# Patient Record
Sex: Male | Born: 1970
Health system: Southern US, Community
[De-identification: ages and names within clinical notes are randomized; demographics above are authoritative.]

## PROBLEM LIST (undated history)

## (undated) DIAGNOSIS — E559 Vitamin D deficiency, unspecified: Secondary | ICD-10-CM

## (undated) DIAGNOSIS — R5383 Other fatigue: Secondary | ICD-10-CM

## (undated) DIAGNOSIS — R0602 Shortness of breath: Secondary | ICD-10-CM

## (undated) DIAGNOSIS — B019 Varicella without complication: Secondary | ICD-10-CM

## (undated) DIAGNOSIS — E785 Hyperlipidemia, unspecified: Secondary | ICD-10-CM

## (undated) DIAGNOSIS — J45909 Unspecified asthma, uncomplicated: Secondary | ICD-10-CM

## (undated) DIAGNOSIS — R7303 Prediabetes: Secondary | ICD-10-CM

## (undated) DIAGNOSIS — M1990 Unspecified osteoarthritis, unspecified site: Secondary | ICD-10-CM

## (undated) HISTORY — DX: Varicella without complication: B01.9

## (undated) HISTORY — DX: Unspecified osteoarthritis, unspecified site: M19.90

## (undated) HISTORY — PX: NASAL RECONSTRUCTION: SHX2069

## (undated) HISTORY — DX: Other fatigue: R53.83

## (undated) HISTORY — DX: Shortness of breath: R06.02

## (undated) HISTORY — DX: Vitamin D deficiency, unspecified: E55.9

## (undated) HISTORY — DX: Unspecified asthma, uncomplicated: J45.909

## (undated) HISTORY — DX: Hyperlipidemia, unspecified: E78.5

## (undated) HISTORY — DX: Prediabetes: R73.03

---

## 1997-09-12 HISTORY — PX: KNEE ARTHROSCOPY: SUR90

## 2012-04-03 ENCOUNTER — Encounter: Payer: Self-pay | Admitting: Family Medicine

## 2012-04-03 ENCOUNTER — Ambulatory Visit (INDEPENDENT_AMBULATORY_CARE_PROVIDER_SITE_OTHER): Payer: BC Managed Care – PPO | Admitting: Family Medicine

## 2012-04-03 VITALS — BP 124/80 | HR 92 | Ht 74.0 in | Wt 268.0 lb

## 2012-04-03 DIAGNOSIS — Z0000 Encounter for general adult medical examination without abnormal findings: Secondary | ICD-10-CM

## 2012-04-03 DIAGNOSIS — J301 Allergic rhinitis due to pollen: Secondary | ICD-10-CM

## 2012-04-03 LAB — CBC WITH DIFFERENTIAL/PLATELET
Basophils Absolute: 0 10*3/uL (ref 0.0–0.1)
Eosinophils Relative: 2 % (ref 0–5)
Lymphocytes Relative: 50 % — ABNORMAL HIGH (ref 12–46)
MCV: 85.4 fL (ref 78.0–100.0)
Neutrophils Relative %: 39 % — ABNORMAL LOW (ref 43–77)
Platelets: 241 10*3/uL (ref 150–400)
RDW: 14.8 % (ref 11.5–15.5)
WBC: 5.3 10*3/uL (ref 4.0–10.5)

## 2012-04-03 LAB — COMPREHENSIVE METABOLIC PANEL
ALT: 46 U/L (ref 0–53)
AST: 31 U/L (ref 0–37)
Calcium: 9.6 mg/dL (ref 8.4–10.5)
Chloride: 106 mEq/L (ref 96–112)
Creat: 1.26 mg/dL (ref 0.50–1.35)
Total Bilirubin: 0.5 mg/dL (ref 0.3–1.2)

## 2012-04-03 LAB — HEMOCCULT GUIAC POC 1CARD (OFFICE)

## 2012-04-03 LAB — POCT URINALYSIS DIPSTICK
Leukocytes, UA: NEGATIVE
Nitrite, UA: NEGATIVE
Protein, UA: NEGATIVE
Urobilinogen, UA: NEGATIVE

## 2012-04-03 LAB — LIPID PANEL
Total CHOL/HDL Ratio: 5.4 Ratio
VLDL: 21 mg/dL (ref 0–40)

## 2012-04-03 NOTE — Progress Notes (Signed)
  Subjective:    Patient ID: Steven Gillespie, male    DOB: 12/20/1970, 41 y.o.   MRN: [id number]  HPI He is here for complete examination. He has no particular concerns or complaints. He does have underlying allergies and treats this with over-the-counter medications. He admits to not exercising or eating appropriately. He is married with 3 children. His work is going quite well. Family and social history was reviewed.   Review of Systems  Constitutional: Negative.   HENT: Negative.   Eyes: Negative.   Respiratory: Negative.   Gastrointestinal: Negative.   Genitourinary: Negative.   Musculoskeletal: Negative.   Neurological: Negative.   Hematological: Negative.   Psychiatric/Behavioral: Negative.        Objective:   Physical Exam BP 124/80  Pulse 92  Ht 6\' 2"  (1.88 m)  Wt 268 lb (121.564 kg)  BMI 34.41 kg/m2  SpO2 98%  General Appearance:    Alert, cooperative, no distress, appears stated age  Head:    Normocephalic, without obvious abnormality, atraumatic  Eyes:    PERRL, conjunctiva/corneas clear, EOM's intact, fundi    benign  Ears:    Normal TM's and external ear canals  Nose:   Nares normal, mucosa normal, no drainage or sinus   tenderness  Throat:   Lips, mucosa, and tongue normal; teeth and gums normal  Neck:   Supple, no lymphadenopathy;  thyroid:  no   enlargement/tenderness/nodules; no carotid   bruit or JVD  Back:    Spine nontender, no curvature, ROM normal, no CVA     tenderness  Lungs:     Clear to auscultation bilaterally without wheezes, rales or     ronchi; respirations unlabored  Chest Wall:    No tenderness or deformity   Heart:    Regular rate and rhythm, S1 and S2 normal, no murmur, rub   or gallop  Breast Exam:    No chest wall tenderness, masses or gynecomastia  Abdomen:     Soft, non-tender, nondistended, normoactive bowel sounds,    no masses, no hepatosplenomegaly  Genitalia:    Normal male external genitalia without lesions.  Testicles without  masses.  No inguinal hernias.  Rectal:    Normal sphincter tone, no masses or tenderness; guaiac negative stool.  Prostate smooth, no nodules, not enlarged.  Extremities:   No clubbing, cyanosis or edema  Pulses:   2+ and symmetric all extremities  Skin:   Skin color, texture, turgor normal, no rashes or lesions  Lymph nodes:   Cervical, supraclavicular, and axillary nodes normal  Neurologic:   CNII-XII intact, normal strength, sensation and gait; reflexes 2+ and symmetric throughout          Psych:   Normal mood, affect, hygiene and grooming.           Assessment & Plan:   1. Routine general medical examination at a health care facility  Hemoccult - 1 Card (office), CBC with Differential, Comprehensive metabolic panel, Lipid panel, POCT Urinalysis Dipstick  2. Allergic rhinitis due to pollen     discussed for him the need to make dietary changes especially in regard to cutting back on carbohydrates as well as increasing his physical activities.

## 2012-04-04 NOTE — Progress Notes (Signed)
Mailed copy of labs and diet info 

## 2014-05-20 ENCOUNTER — Ambulatory Visit (INDEPENDENT_AMBULATORY_CARE_PROVIDER_SITE_OTHER): Payer: 59 | Admitting: Family Medicine

## 2014-05-20 VITALS — BP 120/80 | HR 81 | Temp 99.1°F | Resp 24 | Ht 73.0 in | Wt 267.5 lb

## 2014-05-20 DIAGNOSIS — H65199 Other acute nonsuppurative otitis media, unspecified ear: Secondary | ICD-10-CM

## 2014-05-20 DIAGNOSIS — H6690 Otitis media, unspecified, unspecified ear: Secondary | ICD-10-CM | POA: Insufficient documentation

## 2014-05-20 DIAGNOSIS — H65113 Acute and subacute allergic otitis media (mucoid) (sanguinous) (serous), bilateral: Secondary | ICD-10-CM

## 2014-05-20 DIAGNOSIS — H65119 Acute and subacute allergic otitis media (mucoid) (sanguinous) (serous), unspecified ear: Secondary | ICD-10-CM

## 2014-05-20 MED ORDER — FLUTICASONE PROPIONATE 50 MCG/ACT NA SUSP: 2.0000 | Freq: Every day | NASAL | Status: DC

## 2014-05-20 MED ORDER — AMOXICILLIN 875 MG PO TABS: 875.0000 mg | ORAL_TABLET | Freq: Two times a day (BID) | ORAL | Status: DC

## 2014-05-20 NOTE — Progress Notes (Signed)
  Steven Gillespie - 43 y.o. male MRN [id number]  Date of birth: 1971/03/25  SUBJECTIVE:  Including CC & ROS.  The patient presents for evaluation of: Chief Complaint  Patient presents with  . Cough    Coughing up green phelgm  . Sore Throat    Exposure to Strep  . Sinusitis    Sore Throat; Head Pressure: Pt reports 4 days of the above sx. Child recently diagnosed with STREP ~2 weeks ago.  Was feeling well until 4-5 days ago and has had worsening sx since that time. Taking OTC NSAIDs without significant relief.  Throat pain is moderate to severe.  No difficulty swallowing/breathing.  + Headache; some facial pressure/fullness consistent with sinus infections he typically gets this time of the year.  On Fluticasone for allergic rhinitis but ran out ~10 days ago.  HISTORY: Past Medical, Surgical, Social, and Family History Reviewed & Updated per EMR. Pertinent Historical Findings include: Seasonal allergies   OBJECTIVE FINDINGS:  VS:  HT:6\' 1"  (185.4 cm)   WT:267 lb 8 oz (121.337 kg)  BMI:35.4          BP:120/80 mmHg  HR:81bpm  TEMP:99.1 F (37.3 C)(Oral)  RESP:95 %  PHYSICAL EXAM:            GENERAL:  Adult well built [place] American  male. In no discomfort; no respiratory distress                PSYCH:  alert and appropriate, good insight      HNEENT:  mmm, no JVD, + anterior lymphadenopathy. Tonsillary hypertrophy with minimal exudate; posterior-oropharyngeal erythema.  L>R TM with poor light reflection; cloudy effusion with surrounding erythema of TM    CARDIAC:  RRR, S1/S2 heard, no murmur       LUNGS:  CTA B, no wheezes, no crackles  ABDOMEN:       EXTREM:  Warm, well perfused.  Moves all 4 extremities spontaneously; no lateralization.  Pedal pulses 2/4.  not pretibial edema.   ASSESSMENT: 1. Acute mucoid otitis media of both ears    Given overall symptoms and findings on otoscopic exam likely bacterial etiology  PLAN: See problem based charting & AVS for additional  documentation. Rx for Amoxicillin X 14 days for otitis media. OTC NSAID for discomfort Restart Allergy treatment given likely contributing to symptomatology Return if symptoms worsen or fail to improve.     I agree with A/P above . Dr Cardell Peach 05/21/14

## 2014-05-20 NOTE — Patient Instructions (Signed)

## 2018-08-28 ENCOUNTER — Encounter: Payer: Self-pay | Admitting: Family Medicine

## 2018-08-28 ENCOUNTER — Ambulatory Visit (INDEPENDENT_AMBULATORY_CARE_PROVIDER_SITE_OTHER): Payer: 59 | Admitting: Family Medicine

## 2018-08-28 VITALS — BP 120/70 | HR 85 | Ht 73.0 in | Wt 281.5 lb

## 2018-08-28 DIAGNOSIS — Z6837 Body mass index (BMI) 37.0-37.9, adult: Secondary | ICD-10-CM | POA: Diagnosis not present

## 2018-08-28 DIAGNOSIS — E7800 Pure hypercholesterolemia, unspecified: Secondary | ICD-10-CM | POA: Diagnosis not present

## 2018-08-28 DIAGNOSIS — D229 Melanocytic nevi, unspecified: Secondary | ICD-10-CM | POA: Insufficient documentation

## 2018-08-28 DIAGNOSIS — Z0000 Encounter for general adult medical examination without abnormal findings: Secondary | ICD-10-CM

## 2018-08-28 DIAGNOSIS — E6609 Other obesity due to excess calories: Secondary | ICD-10-CM | POA: Diagnosis not present

## 2018-08-28 LAB — CBC
HCT: 44.5 % (ref 39.0–52.0)
Hemoglobin: 15.2 g/dL (ref 13.0–17.0)
MCHC: 34.1 g/dL (ref 30.0–36.0)
MCV: 88.2 fl (ref 78.0–100.0)
Platelets: 249 10*3/uL (ref 150.0–400.0)
RBC: 5.04 Mil/uL (ref 4.22–5.81)
RDW: 14.7 % (ref 11.5–15.5)
WBC: 5.9 10*3/uL (ref 4.0–10.5)

## 2018-08-28 LAB — LIPID PANEL
CHOLESTEROL: 275 mg/dL — AB (ref 0–200)
HDL: 53 mg/dL (ref >39.00)
LDL Cholesterol: 199 mg/dL — ABNORMAL HIGH (ref 0–99)
NonHDL: 222.19
TRIGLYCERIDES: 114 mg/dL (ref 0.0–149.0)
Total CHOL/HDL Ratio: 5
VLDL: 22.8 mg/dL (ref 0.0–40.0)

## 2018-08-28 LAB — URINALYSIS, ROUTINE W REFLEX MICROSCOPIC
Bilirubin Urine: NEGATIVE
Hgb urine dipstick: NEGATIVE
Ketones, ur: NEGATIVE
Leukocytes, UA: NEGATIVE
Nitrite: NEGATIVE
RBC / HPF: NONE SEEN (ref 0–?)
Specific Gravity, Urine: <=1.005 — AB (ref 1.000–1.030)
Total Protein, Urine: NEGATIVE
Urine Glucose: NEGATIVE
Urobilinogen, UA: 0.2 (ref 0.0–1.0)
pH: 6 (ref 5.0–8.0)

## 2018-08-28 LAB — COMPREHENSIVE METABOLIC PANEL
ALT: 39 U/L (ref 0–53)
AST: 26 U/L (ref 0–37)
Albumin: 4.6 g/dL (ref 3.5–5.2)
Alkaline Phosphatase: 53 U/L (ref 39–117)
BUN: 14 mg/dL (ref 6–23)
CO2: 28 mEq/L (ref 19–32)
Calcium: 9.8 mg/dL (ref 8.4–10.5)
Chloride: 103 mEq/L (ref 96–112)
Creatinine, Ser: 1.18 mg/dL (ref 0.40–1.50)
GFR: 84.54 mL/min (ref >60.00)
Glucose, Bld: 93 mg/dL (ref 70–99)
Potassium: 3.9 mEq/L (ref 3.5–5.1)
Sodium: 137 mEq/L (ref 135–145)
Total Bilirubin: 0.5 mg/dL (ref 0.2–1.2)
Total Protein: 7.5 g/dL (ref 6.0–8.3)

## 2018-08-28 LAB — LDL CHOLESTEROL, DIRECT: Direct LDL: 191 mg/dL

## 2018-08-28 NOTE — Progress Notes (Addendum)
Established Patient Office Visit  Subjective:  Patient ID: Steven Gillespie, male    DOB: 09-Sep-1971  Age: 47 y.o. MRN: [id number]  CC:  Chief Complaint  Patient presents with  . Establish Care    HPI [name] presents for a physical exam and would like for me to take a look at a mole on his right lower leg.  Mole is been changing over the last year or so it is increasing in size by circumference in depth.  It has not cracked or bled.  Patient works as a Landscape architect.  He is married and has 12 and 14-year-old sons.  He does not smoke or use illicit drugs.  He rarely drinks alcohol.  His father is 1 years old and has a past medical history of hypertension, prostate cancer, COPD, elevated cholesterol, peripheral vascular disease.  He developed prostate cancer in the last 5 years and has a history of substance abuse in his distant past.  Patient's mom is 29 and has a history of elevated cholesterol.  Chart review shows that patient had an LDL of 201 back in 2013.  He is not exercising regularly.  Past Medical History:  Diagnosis Date  . Asthma   . Chicken pox     Past Surgical History:  Procedure Laterality Date  . KNEE ARTHROSCOPY  1999    Family History  Problem Relation Age of Onset  . Hyperlipidemia Mother   . Asthma Father   . Cancer Father   . Drug abuse Father   . Hearing loss Father   . Heart disease Father   . Heart attack Father   . Hypertension Father     Social History   Socioeconomic History  . Marital status: Married    Spouse name: Not on file  . Number of children: Not on file  . Years of education: Not on file  . Highest education level: Not on file  Occupational History  . Not on file  Social Needs  . Financial resource strain: Not on file  . Food insecurity:    Worry: Not on file    Inability: Not on file  . Transportation needs:    Medical: Not on file    Non-medical: Not on file  Tobacco Use  . Smoking status: Never  Smoker  . Smokeless tobacco: Never Used  Substance and Sexual Activity  . Alcohol use: Yes    Alcohol/week: 4.0 standard drinks    Types: 4 Cans of beer per week  . Drug use: No  . Sexual activity: Yes    Partners: Female  Lifestyle  . Physical activity:    Days per week: Not on file    Minutes per session: Not on file  . Stress: Not on file  Relationships  . Social connections:    Talks on phone: Not on file    Gets together: Not on file    Attends religious service: Not on file    Active member of club or organization: Not on file    Attends meetings of clubs or organizations: Not on file    Relationship status: Not on file  . Intimate partner violence:    Fear of current or ex partner: Not on file    Emotionally abused: Not on file    Physically abused: Not on file    Forced sexual activity: Not on file  Other Topics Concern  . Not on file  Social History Narrative  . Not  on file    Outpatient Medications Prior to Visit  Medication Sig Dispense Refill  . amoxicillin (AMOXIL) 875 MG tablet Take 1 tablet (875 mg total) by mouth 2 (two) times daily. 28 tablet 0  . cetirizine (ZYRTEC) 10 MG tablet Take 10 mg by mouth daily.    . fluticasone (FLONASE) 50 MCG/ACT nasal spray Place 2 sprays into both nostrils daily. 16 g 1  . Multiple Vitamins-Minerals (MULTIVITAMIN WITH MINERALS) tablet Take 1 tablet by mouth daily.     No facility-administered medications prior to visit.     No Known Allergies  ROS Review of Systems  Constitutional: Negative.   HENT: Negative.   Eyes: Negative for photophobia and visual disturbance.  Respiratory: Negative.   Cardiovascular: Negative.   Gastrointestinal: Negative.   Endocrine: Negative for polyphagia and polyuria.  Genitourinary: Negative.   Musculoskeletal: Negative for gait problem and joint swelling.  Skin: Positive for color change. Negative for pallor, rash and wound.  Allergic/Immunologic: Negative for immunocompromised  state.  Neurological: Negative for seizures, light-headedness and numbness.  Hematological: Does not bruise/bleed easily.  Psychiatric/Behavioral: Negative.       Objective:    Physical Exam  Constitutional: He is oriented to person, place, and time. He appears well-developed and well-nourished. No distress.  HENT:  Head: Normocephalic and atraumatic.  Right Ear: External ear normal.  Left Ear: External ear normal.  Mouth/Throat: Oropharynx is clear and moist. No oropharyngeal exudate.  Eyes: Pupils are equal, round, and reactive to light. Conjunctivae are normal. Right eye exhibits no discharge. Left eye exhibits no discharge. No scleral icterus.  Neck: Neck supple. No JVD present. No tracheal deviation present. No thyromegaly present.  Cardiovascular: Normal rate, regular rhythm and normal heart sounds.  Pulmonary/Chest: Effort normal and breath sounds normal. No stridor.  Abdominal: Soft. Bowel sounds are normal. He exhibits no distension. There is no abdominal tenderness. There is no rebound and no guarding. Hernia confirmed negative in the right inguinal area and confirmed negative in the left inguinal area.  Genitourinary: Right testis shows no mass, no swelling and no tenderness. Right testis is descended. Left testis shows no mass, no swelling and no tenderness. Left testis is descended. Circumcised. No hypospadias, penile erythema or penile tenderness. No discharge found.  Musculoskeletal:        General: No edema.  Lymphadenopathy:    He has no cervical adenopathy.       Right: No inguinal adenopathy present.       Left: No inguinal adenopathy present.  Neurological: He is alert and oriented to person, place, and time.  Skin: Skin is warm. He is not diaphoretic.     Psychiatric: He has a normal mood and affect. His behavior is normal.    BP 120/70   Pulse 85   Ht 6\' 1"  (1.854 m)   Wt 281 lb 8 oz (127.7 kg)   SpO2 97%   BMI 37.14 kg/m  Wt Readings from Last 3  Encounters:  08/28/18 281 lb 8 oz (127.7 kg)  05/20/14 267 lb 8 oz (121.3 kg)  04/03/12 268 lb (121.6 kg)   BP Readings from Last 3 Encounters:  08/28/18 120/70  05/20/14 120/80  04/03/12 124/80   Guideline developer:  UpToDate (see UpToDate for funding source) Date Released: June 2014  Health Maintenance Due  Topic Date Due  . HIV Screening  02/09/1985  . TETANUS/TDAP  02/09/1989    There are no preventive care reminders to display for this patient.  No  results found for: TSH Lab Results  Component Value Date   WBC 5.9 08/28/2018   HGB 15.2 08/28/2018   HCT 44.5 08/28/2018   MCV 88.2 08/28/2018   PLT 249.0 08/28/2018   Lab Results  Component Value Date   NA 137 08/28/2018   K 3.9 08/28/2018   CO2 28 08/28/2018   GLUCOSE 93 08/28/2018   BUN 14 08/28/2018   CREATININE 1.18 08/28/2018   BILITOT 0.5 08/28/2018   ALKPHOS 53 08/28/2018   AST 26 08/28/2018   ALT 39 08/28/2018   PROT 7.5 08/28/2018   ALBUMIN 4.6 08/28/2018   CALCIUM 9.8 08/28/2018   GFR 84.54 08/28/2018   Lab Results  Component Value Date   CHOL 275 (H) 08/28/2018   Lab Results  Component Value Date   HDL 53.00 08/28/2018   Lab Results  Component Value Date   LDLCALC 199 (H) 08/28/2018   Lab Results  Component Value Date   TRIG 114.0 08/28/2018   Lab Results  Component Value Date   CHOLHDL 5 08/28/2018   No results found for: HGBA1C    Assessment & Plan:   Problem List Items Addressed This Visit      Musculoskeletal and Integument   Atypical nevi   Relevant Orders   Ambulatory referral to Dermatology     Other   Health care maintenance - Primary   Relevant Orders   Comprehensive metabolic panel (Completed)   CBC (Completed)   Lipid panel (Completed)   Urinalysis, Routine w reflex microscopic (Completed)   LDL cholesterol, direct (Completed)   Class 2 obesity due to excess calories with body mass index (BMI) of 37.0 to 37.9 in adult   Elevated LDL cholesterol level    Relevant Medications   rosuvastatin (CRESTOR) 20 MG tablet      Meds ordered this encounter  Medications  . rosuvastatin (CRESTOR) 20 MG tablet    Sig: Take 1 tablet (20 mg total) by mouth daily.    Dispense:  90 tablet    Refill:  1    Follow-up: Return in about 6 months (around 02/27/2019), or if symptoms worsen or fail to improve.    Patient was given information on health promotion and disease prevention.  He was given information on exercising to lose weight.  Labs were drawn today.  Am concerned about the lesion on his right lower leg.  While this could represent a wart could also represent a melanoma in an invasive stage.  Urgent dermatology referral for evaluation.  Hopefully they will consider it to be a benign lesion.

## 2018-08-28 NOTE — Patient Instructions (Signed)
Exercising to Lose Weight Exercising can help you to lose weight. In order to lose weight through exercise, you need to do vigorous-intensity exercise. You can tell that you are exercising with vigorous intensity if you are breathing very hard and fast and cannot hold a conversation while exercising. Moderate-intensity exercise helps to maintain your current weight. You can tell that you are exercising at a moderate level if you have a higher heart rate and faster breathing, but you are still able to hold a conversation. How often should I exercise? Choose an activity that you enjoy and set realistic goals. Your health care provider can help you to make an activity plan that works for you. Exercise regularly as directed by your health care provider. This may include:  Doing resistance training twice each week, such as: ? Push-ups. ? Sit-ups. ? Lifting weights. ? Using resistance bands.  Doing a given intensity of exercise for a given amount of time. Choose from these options: ? 150 minutes of moderate-intensity exercise every week. ? 75 minutes of vigorous-intensity exercise every week. ? A mix of moderate-intensity and vigorous-intensity exercise every week.  Children, pregnant women, people who are out of shape, people who are overweight, and older adults may need to consult a health care provider for individual recommendations. If you have any sort of medical condition, be sure to consult your health care provider before starting a new exercise program. What are some activities that can help me to lose weight?  Walking at a rate of at least 4.5 miles an hour.  Jogging or running at a rate of 5 miles per hour.  Biking at a rate of at least 10 miles per hour.  Lap swimming.  Roller-skating or in-line skating.  Cross-country skiing.  Vigorous competitive sports, such as football, basketball, and soccer.  Jumping rope.  Aerobic dancing. How can I be more active in my day-to-day  activities?  Use the stairs instead of the elevator.  Take a walk during your lunch break.  If you drive, park your car farther away from work or school.  If you take public transportation, get off one stop early and walk the rest of the way.  Make all of your phone calls while standing up and walking around.  Get up, stretch, and walk around every 30 minutes throughout the day. What guidelines should I follow while exercising?  Do not exercise so much that you hurt yourself, feel dizzy, or get very short of breath.  Consult your health care provider prior to starting a new exercise program.  Wear comfortable clothes and shoes with good support.  Drink plenty of water while you exercise to prevent dehydration or heat stroke. Body water is lost during exercise and must be replaced.  Work out until you breathe faster and your heart beats faster. This information is not intended to replace advice given to you by your health care provider. Make sure you discuss any questions you have with your health care provider. Document Released: 10/01/2010 Document Revised: 02/04/2016 Document Reviewed: 01/30/2014 Elsevier Interactive Patient Education  2018 [place] Maintenance, Male A healthy lifestyle and preventive care is important for your health and wellness. Ask your health care provider about what schedule of regular examinations is right for you. What should I know about weight and diet? Eat a Healthy Diet  Eat plenty of vegetables, fruits, whole grains, low-fat dairy products, and lean protein.  Do not eat a lot of foods high in solid fats,  added sugars, or salt.  Maintain a Healthy Weight Regular exercise can help you achieve or maintain a healthy weight. You should:  Do at least 150 minutes of exercise each week. The exercise should increase your heart rate and make you sweat (moderate-intensity exercise).  Do strength-training exercises at least twice a  week.  Watch Your Levels of Cholesterol and Blood Lipids  Have your blood tested for lipids and cholesterol every 5 years starting at 48 years of age. If you are at high risk for heart disease, you should start having your blood tested when you are 48 years old. You may need to have your cholesterol levels checked more often if: ? Your lipid or cholesterol levels are high. ? You are older than 48 years of age. ? You are at high risk for heart disease.  What should I know about cancer screening? Many types of cancers can be detected early and may often be prevented. Lung Cancer  You should be screened every year for lung cancer if: ? You are a current smoker who has smoked for at least 30 years. ? You are a former smoker who has quit within the past 15 years.  Talk to your health care provider about your screening options, when you should start screening, and how often you should be screened.  Colorectal Cancer  Routine colorectal cancer screening usually begins at 48 years of age and should be repeated every 5-10 years until you are 48 years old. You may need to be screened more often if early forms of precancerous polyps or small growths are found. Your health care provider may recommend screening at an earlier age if you have risk factors for colon cancer.  Your health care provider may recommend using home test kits to check for hidden blood in the stool.  A small camera at the end of a tube can be used to examine your colon (sigmoidoscopy or colonoscopy). This checks for the earliest forms of colorectal cancer.  Prostate and Testicular Cancer  Depending on your age and overall health, your health care provider may do certain tests to screen for prostate and testicular cancer.  Talk to your health care provider about any symptoms or concerns you have about testicular or prostate cancer.  Skin Cancer  Check your skin from head to toe regularly.  Tell your health care provider  about any new moles or changes in moles, especially if: ? There is a change in a mole's size, shape, or color. ? You have a mole that is larger than a pencil eraser.  Always use sunscreen. Apply sunscreen liberally and repeat throughout the day.  Protect yourself by wearing long sleeves, pants, a wide-brimmed hat, and sunglasses when outside.  What should I know about heart disease, diabetes, and high blood pressure?  If you are 19-65 years of age, have your blood pressure checked every 3-5 years. If you are 36 years of age or older, have your blood pressure checked every year. You should have your blood pressure measured twice-once when you are at a hospital or clinic, and once when you are not at a hospital or clinic. Record the average of the two measurements. To check your blood pressure when you are not at a hospital or clinic, you can use: ? An automated blood pressure machine at a pharmacy. ? A home blood pressure monitor.  Talk to your health care provider about your target blood pressure.  If you are between 30-34 years old, ask  your health care provider if you should take aspirin to prevent heart disease.  Have regular diabetes screenings by checking your fasting blood sugar level. ? If you are at a normal weight and have a low risk for diabetes, have this test once every three years after the age of 75. ? If you are overweight and have a high risk for diabetes, consider being tested at a younger age or more often.  A one-time screening for abdominal aortic aneurysm (AAA) by ultrasound is recommended for men aged 68-75 years who are current or former smokers. What should I know about preventing infection? Hepatitis B If you have a higher risk for hepatitis B, you should be screened for this virus. Talk with your health care provider to find out if you are at risk for hepatitis B infection. Hepatitis C Blood testing is recommended for:  Everyone born from [birth year] through  1965.  Anyone with known risk factors for hepatitis C.  Sexually Transmitted Diseases (STDs)  You should be screened each year for STDs including gonorrhea and chlamydia if: ? You are sexually active and are younger than 48 years of age. ? You are older than 48 years of age and your health care provider tells you that you are at risk for this type of infection. ? Your sexual activity has changed since you were last screened and you are at an increased risk for chlamydia or gonorrhea. Ask your health care provider if you are at risk.  Talk with your health care provider about whether you are at high risk of being infected with HIV. Your health care provider may recommend a prescription medicine to help prevent HIV infection.  What else can I do?  Schedule regular health, dental, and eye exams.  Stay current with your vaccines (immunizations).  Do not use any tobacco products, such as cigarettes, chewing tobacco, and e-cigarettes. If you need help quitting, ask your health care provider.  Limit alcohol intake to no more than 2 drinks per day. One drink equals 12 ounces of beer, 5 ounces of wine, or 1 ounces of hard liquor.  Do not use street drugs.  Do not share needles.  Ask your health care provider for help if you need support or information about quitting drugs.  Tell your health care provider if you often feel depressed.  Tell your health care provider if you have ever been abused or do not feel safe at home. This information is not intended to replace advice given to you by your health care provider. Make sure you discuss any questions you have with your health care provider. Document Released: 02/25/2008 Document Revised: 04/27/2016 Document Reviewed: 06/02/2015 Elsevier Interactive Patient Education  [employer].

## 2018-08-29 ENCOUNTER — Encounter: Payer: Self-pay | Admitting: Family Medicine

## 2018-08-31 DIAGNOSIS — E7800 Pure hypercholesterolemia, unspecified: Secondary | ICD-10-CM | POA: Insufficient documentation

## 2018-08-31 MED ORDER — ROSUVASTATIN CALCIUM 20 MG PO TABS: 20.0000 mg | ORAL_TABLET | Freq: Every day | ORAL | 1 refills | Status: DC

## 2018-08-31 NOTE — Addendum Note (Signed)
Addended by: Abelino Derrick A on: 08/31/2018 01:06 PM   Modules accepted: Orders

## 2018-09-24 DIAGNOSIS — L918 Other hypertrophic disorders of the skin: Secondary | ICD-10-CM | POA: Diagnosis not present

## 2018-09-24 DIAGNOSIS — D485 Neoplasm of uncertain behavior of skin: Secondary | ICD-10-CM | POA: Diagnosis not present

## 2018-09-24 DIAGNOSIS — L821 Other seborrheic keratosis: Secondary | ICD-10-CM | POA: Diagnosis not present

## 2018-09-26 DIAGNOSIS — B078 Other viral warts: Secondary | ICD-10-CM | POA: Diagnosis not present

## 2018-10-01 DIAGNOSIS — B078 Other viral warts: Secondary | ICD-10-CM | POA: Diagnosis not present

## 2018-10-19 DIAGNOSIS — L821 Other seborrheic keratosis: Secondary | ICD-10-CM | POA: Diagnosis not present

## 2018-10-19 DIAGNOSIS — L918 Other hypertrophic disorders of the skin: Secondary | ICD-10-CM | POA: Diagnosis not present

## 2018-11-01 DIAGNOSIS — B078 Other viral warts: Secondary | ICD-10-CM | POA: Diagnosis not present

## 2019-02-27 ENCOUNTER — Encounter: Payer: Self-pay | Admitting: Family Medicine

## 2019-02-27 ENCOUNTER — Telehealth (INDEPENDENT_AMBULATORY_CARE_PROVIDER_SITE_OTHER): Payer: 59 | Admitting: Family Medicine

## 2019-02-27 VITALS — BP 120/78 | Wt 270.0 lb

## 2019-02-27 DIAGNOSIS — E7800 Pure hypercholesterolemia, unspecified: Secondary | ICD-10-CM

## 2019-02-27 DIAGNOSIS — Z6837 Body mass index (BMI) 37.0-37.9, adult: Secondary | ICD-10-CM | POA: Diagnosis not present

## 2019-02-27 DIAGNOSIS — E6609 Other obesity due to excess calories: Secondary | ICD-10-CM

## 2019-02-27 DIAGNOSIS — D229 Melanocytic nevi, unspecified: Secondary | ICD-10-CM

## 2019-02-27 MED ORDER — ROSUVASTATIN CALCIUM 20 MG PO TABS: 20.0000 mg | ORAL_TABLET | Freq: Every day | ORAL | 1 refills | Status: DC

## 2019-02-27 NOTE — Progress Notes (Addendum)
Established Patient Office Visit  Subjective:  Patient ID: Steven Gillespie, male    DOB: 02/11/1971  Age: 48 y.o. MRN: [id number]  CC:  Chief Complaint  Patient presents with  . Follow-up    HPI Steven Gillespie presents for follow-up of his LDL cholesterol.  Patient never started the Crestor because he said it was never sent to the pharmacy.  Both parents have elevated cholesterol and his dad suffered an MI at age 40.  Patient continues not to smoke or use illicit drugs.  Occasional beers.  He and his wife had started an exercise program by walking and lifting some weights.  He is actually been able to lose some weight by his home scales.  Past Medical History:  Diagnosis Date  . Asthma   . Chicken pox     Past Surgical History:  Procedure Laterality Date  . KNEE ARTHROSCOPY  1999    Family History  Problem Relation Age of Onset  . Hyperlipidemia Mother   . Asthma Father   . Cancer Father   . Drug abuse Father   . Hearing loss Father   . Heart disease Father   . Heart attack Father   . Hypertension Father     Social History   Socioeconomic History  . Marital status: Married    Spouse name: Not on file  . Number of children: Not on file  . Years of education: Not on file  . Highest education level: Not on file  Occupational History  . Not on file  Social Needs  . Financial resource strain: Not on file  . Food insecurity    Worry: Not on file    Inability: Not on file  . Transportation needs    Medical: Not on file    Non-medical: Not on file  Tobacco Use  . Smoking status: Never Smoker  . Smokeless tobacco: Never Used  Substance and Sexual Activity  . Alcohol use: Yes    Alcohol/week: 4.0 standard drinks    Types: 4 Cans of beer per week  . Drug use: No  . Sexual activity: Yes    Partners: Female  Lifestyle  . Physical activity    Days per week: Not on file    Minutes per session: Not on file  . Stress: Not on file  Relationships  . Social  Herbalist on phone: Not on file    Gets together: Not on file    Attends religious service: Not on file    Active member of club or organization: Not on file    Attends meetings of clubs or organizations: Not on file    Relationship status: Not on file  . Intimate partner violence    Fear of current or ex partner: Not on file    Emotionally abused: Not on file    Physically abused: Not on file    Forced sexual activity: Not on file  Other Topics Concern  . Not on file  Social History Narrative  . Not on file    Outpatient Medications Prior to Visit  Medication Sig Dispense Refill  . rosuvastatin (CRESTOR) 20 MG tablet Take 1 tablet (20 mg total) by mouth daily. 90 tablet 1   No facility-administered medications prior to visit.     No Known Allergies  ROS Review of Systems  Constitutional: Negative.   Respiratory: Negative.   Cardiovascular: Negative.   Psychiatric/Behavioral: Negative.       Objective:  Physical Exam  Constitutional: He is oriented to person, place, and time. He appears well-developed and well-nourished. No distress.  HENT:  Head: Normocephalic and atraumatic.  Right Ear: External ear normal.  Left Ear: External ear normal.  Eyes: Conjunctivae are normal. Right eye exhibits no discharge. Left eye exhibits no discharge. No scleral icterus.  Neck: No tracheal deviation present.  Pulmonary/Chest: Effort normal. No stridor.  Neurological: He is alert and oriented to person, place, and time.  Skin: He is not diaphoretic.  Psychiatric: He has a normal mood and affect. His behavior is normal.    BP 120/78   Wt 270 lb (122.5 kg)   BMI 35.62 kg/m  Wt Readings from Last 3 Encounters:  02/27/19 270 lb (122.5 kg)  08/28/18 281 lb 8 oz (127.7 kg)  05/20/14 267 lb 8 oz (121.3 kg)   BP Readings from Last 3 Encounters:  02/27/19 120/78  08/28/18 120/70  05/20/14 120/80   Guideline developer:  UpToDate (see UpToDate for funding source)  Date Released: June 2014  Health Maintenance Due  Topic Date Due  . HIV Screening  02/09/1985  . TETANUS/TDAP  02/09/1989    There are no preventive care reminders to display for this patient.  No results found for: TSH Lab Results  Component Value Date   WBC 5.9 08/28/2018   HGB 15.2 08/28/2018   HCT 44.5 08/28/2018   MCV 88.2 08/28/2018   PLT 249.0 08/28/2018   Lab Results  Component Value Date   NA 137 08/28/2018   K 3.9 08/28/2018   CO2 28 08/28/2018   GLUCOSE 93 08/28/2018   BUN 14 08/28/2018   CREATININE 1.18 08/28/2018   BILITOT 0.5 08/28/2018   ALKPHOS 53 08/28/2018   AST 26 08/28/2018   ALT 39 08/28/2018   PROT 7.5 08/28/2018   ALBUMIN 4.6 08/28/2018   CALCIUM 9.8 08/28/2018   GFR 84.54 08/28/2018   Lab Results  Component Value Date   CHOL 275 (H) 08/28/2018   Lab Results  Component Value Date   HDL 53.00 08/28/2018   Lab Results  Component Value Date   LDLCALC 199 (H) 08/28/2018   Lab Results  Component Value Date   TRIG 114.0 08/28/2018   Lab Results  Component Value Date   CHOLHDL 5 08/28/2018   No results found for: HGBA1C    Assessment & Plan:   Problem List Items Addressed This Visit      Musculoskeletal and Integument   Atypical nevi   Relevant Orders   Ambulatory referral to Dermatology     Other   Class 2 obesity due to excess calories with body mass index (BMI) of 37.0 to 37.9 in adult - Primary   Relevant Orders   CBC   Urinalysis, Routine w reflex microscopic   TSH   Elevated LDL cholesterol level   Relevant Medications   rosuvastatin (CRESTOR) 20 MG tablet   Other Relevant Orders   CBC   Comprehensive metabolic panel   LDL cholesterol, direct   Lipid panel   Urinalysis, Routine w reflex microscopic   TSH      Meds ordered this encounter  Medications  . rosuvastatin (CRESTOR) 20 MG tablet    Sig: Take 1 tablet (20 mg total) by mouth daily.    Dispense:  90 tablet    Refill:  1    Follow-up: Return in  about 3 months (around 05/30/2019).   Virtual Visit via Video Note  I connected with Docia Chuck on  02/27/19 at  8:00 AM EDT by a video enabled telemedicine application and verified that I am speaking with the correct person using two identifiers.  Location: Patient: home Provider:    I discussed the limitations of evaluation and management by telemedicine and the availability of in person appointments. The patient expressed understanding and agreed to proceed.  History of Present Illness:    Observations/Objective:   Assessment and Plan:   Follow Up Instructions:    I discussed the assessment and treatment plan with the patient. The patient was provided an opportunity to ask questions and all were answered. The patient agreed with the plan and demonstrated an understanding of the instructions.   The patient was advised to call back or seek an in-person evaluation if the symptoms worsen or if the condition fails to improve as anticipated.  I provided 20 minutes of non-face-to-face time during this encounter.   Libby Maw, MD  Asked patient to please let us know if his prescription does not go through the pharmacy or if we make an appointment for him and he does not hear about it.  We discussed Crestor and its side effects.  He is to follow-up in 3 months or sooner if needed.  Encouraged the patient to exercise, lower fat and cholesterol in his diet and continue his weight loss efforts.  6/24 addendum: Patient sent message to Crestor given him a headache and would like something else.  Just sent in a prescription for atorvastatin.

## 2019-03-06 ENCOUNTER — Encounter: Payer: Self-pay | Admitting: Family Medicine

## 2019-03-06 MED ORDER — ATORVASTATIN CALCIUM 20 MG PO TABS: 20.0000 mg | ORAL_TABLET | Freq: Every day | ORAL | 1 refills | Status: DC

## 2019-03-06 NOTE — Addendum Note (Signed)
Addended by: Jon Billings on: 03/06/2019 11:24 AM   Modules accepted: Orders

## 2019-04-16 DIAGNOSIS — Z20828 Contact with and (suspected) exposure to other viral communicable diseases: Secondary | ICD-10-CM | POA: Diagnosis not present

## 2019-09-11 ENCOUNTER — Other Ambulatory Visit: Payer: Self-pay | Admitting: Family Medicine

## 2019-09-11 DIAGNOSIS — E7800 Pure hypercholesterolemia, unspecified: Secondary | ICD-10-CM

## 2019-09-11 NOTE — Telephone Encounter (Signed)
Rx refilled, called patient to schedule appointment for follow up on medications and to let him know refill was sent in. No answer LMTCB

## 2020-01-29 MED FILL — ATORVASTATIN 20 MG TABLET: 20 | 60 days supply | Qty: 60 | Fill #0

## 2020-03-10 DIAGNOSIS — H524 Presbyopia: Secondary | ICD-10-CM | POA: Diagnosis not present

## 2020-03-10 DIAGNOSIS — Z135 Encounter for screening for eye and ear disorders: Secondary | ICD-10-CM | POA: Diagnosis not present

## 2020-03-31 ENCOUNTER — Other Ambulatory Visit: Payer: Self-pay | Admitting: Family Medicine

## 2020-03-31 DIAGNOSIS — E7800 Pure hypercholesterolemia, unspecified: Secondary | ICD-10-CM

## 2020-03-31 NOTE — Telephone Encounter (Signed)
Last ov 02/27/19 Last fill 09/11/19  #90/0 Pt need an office visit.

## 2020-04-01 MED FILL — ATORVASTATIN 20 MG TABLET: 20 | 30 days supply | Qty: 30 | Fill #0

## 2020-05-05 ENCOUNTER — Other Ambulatory Visit: Payer: Self-pay | Admitting: Family Medicine

## 2020-05-05 DIAGNOSIS — E7800 Pure hypercholesterolemia, unspecified: Secondary | ICD-10-CM

## 2020-05-06 MED ORDER — ATORVASTATIN CALCIUM 20 MG PO TABS: 20.0000 mg | ORAL_TABLET | Freq: Every day | ORAL | 0 refills | Status: DC

## 2020-05-06 MED FILL — ATORVASTATIN CALCIUM 20 MG: 20 | 30 days supply | Qty: 30 | Fill #0

## 2020-05-06 NOTE — Telephone Encounter (Signed)
Medication refill request  Last OV 02/27/19 (telemedicine)  Last refill 04/01/20 // #30 // refills - 0  Forwarding to PCP for approval or denial.

## 2020-05-06 NOTE — Telephone Encounter (Signed)
Appt scheduled per PCP instructions.

## 2020-05-28 ENCOUNTER — Other Ambulatory Visit: Payer: Self-pay

## 2020-05-28 ENCOUNTER — Encounter: Payer: Self-pay | Admitting: Family Medicine

## 2020-05-29 ENCOUNTER — Encounter: Payer: Self-pay | Admitting: Family Medicine

## 2020-05-29 ENCOUNTER — Encounter: Payer: Self-pay | Admitting: Gastroenterology

## 2020-05-29 ENCOUNTER — Ambulatory Visit (INDEPENDENT_AMBULATORY_CARE_PROVIDER_SITE_OTHER): Payer: 59 | Admitting: Family Medicine

## 2020-05-29 VITALS — BP 138/82 | HR 81 | Temp 97.6°F | Ht 74.0 in | Wt 300.0 lb

## 2020-05-29 DIAGNOSIS — R5383 Other fatigue: Secondary | ICD-10-CM | POA: Diagnosis not present

## 2020-05-29 DIAGNOSIS — R7401 Elevation of levels of liver transaminase levels: Secondary | ICD-10-CM

## 2020-05-29 DIAGNOSIS — Z0000 Encounter for general adult medical examination without abnormal findings: Secondary | ICD-10-CM

## 2020-05-29 DIAGNOSIS — E7800 Pure hypercholesterolemia, unspecified: Secondary | ICD-10-CM | POA: Diagnosis not present

## 2020-05-29 DIAGNOSIS — Z6838 Body mass index (BMI) 38.0-38.9, adult: Secondary | ICD-10-CM

## 2020-05-29 DIAGNOSIS — Z23 Encounter for immunization: Secondary | ICD-10-CM | POA: Diagnosis not present

## 2020-05-29 DIAGNOSIS — E6609 Other obesity due to excess calories: Secondary | ICD-10-CM | POA: Diagnosis not present

## 2020-05-29 LAB — COMPREHENSIVE METABOLIC PANEL
ALT: 60 U/L — ABNORMAL HIGH (ref 0–53)
AST: 33 U/L (ref 0–37)
Albumin: 4.5 g/dL (ref 3.5–5.2)
Alkaline Phosphatase: 57 U/L (ref 39–117)
BUN: 20 mg/dL (ref 6–23)
CO2: 30 mEq/L (ref 19–32)
Calcium: 9.7 mg/dL (ref 8.4–10.5)
Chloride: 103 mEq/L (ref 96–112)
Creatinine, Ser: 1.32 mg/dL (ref 0.40–1.50)
GFR: 69.38 mL/min (ref >60.00)
Glucose, Bld: 95 mg/dL (ref 70–99)
Potassium: 3.8 mEq/L (ref 3.5–5.1)
Sodium: 139 mEq/L (ref 135–145)
Total Bilirubin: 0.4 mg/dL (ref 0.2–1.2)
Total Protein: 7.5 g/dL (ref 6.0–8.3)

## 2020-05-29 LAB — CBC
HCT: 44.3 % (ref 39.0–52.0)
Hemoglobin: 14.8 g/dL (ref 13.0–17.0)
MCHC: 33.4 g/dL (ref 30.0–36.0)
MCV: 89.8 fl (ref 78.0–100.0)
Platelets: 235 10*3/uL (ref 150.0–400.0)
RBC: 4.93 Mil/uL (ref 4.22–5.81)
RDW: 14.9 % (ref 11.5–15.5)
WBC: 6 10*3/uL (ref 4.0–10.5)

## 2020-05-29 LAB — URINALYSIS, ROUTINE W REFLEX MICROSCOPIC
Bilirubin Urine: NEGATIVE
Hgb urine dipstick: NEGATIVE
Ketones, ur: NEGATIVE
Leukocytes,Ua: NEGATIVE
Nitrite: NEGATIVE
RBC / HPF: NONE SEEN (ref 0–?)
Specific Gravity, Urine: 1.02 (ref 1.000–1.030)
Total Protein, Urine: NEGATIVE
Urine Glucose: NEGATIVE
Urobilinogen, UA: 0.2 (ref 0.0–1.0)
pH: 6.5 (ref 5.0–8.0)

## 2020-05-29 LAB — LIPID PANEL
Cholesterol: 180 mg/dL (ref 0–200)
HDL: 48.5 mg/dL (ref >39.00)
LDL Cholesterol: 110 mg/dL — ABNORMAL HIGH (ref 0–99)
NonHDL: 131.54
Total CHOL/HDL Ratio: 4
Triglycerides: 110 mg/dL (ref 0.0–149.0)
VLDL: 22 mg/dL (ref 0.0–40.0)

## 2020-05-29 LAB — TSH: TSH: 2.39 u[IU]/mL (ref 0.35–4.50)

## 2020-05-29 LAB — PSA: PSA: 0.63 ng/mL (ref 0.10–4.00)

## 2020-05-29 LAB — LDL CHOLESTEROL, DIRECT: Direct LDL: 111 mg/dL

## 2020-05-29 NOTE — Progress Notes (Signed)
Established Patient Office Visit  Subjective:  Patient ID: Steven Gillespie, male    DOB: 1971/08/18  Age: 49 y.o. MRN: [id number]  CC:  Chief Complaint  Patient presents with  . Annual Exam    CPE, no concerns fasting for labs.     HPI Steven Gillespie presents for a complete physical and follow-up of his elevated cholesterol.  He has been lost to follow-up for almost 2 years now.  He continues with atorvastatin.  He is seeking a masters degree in healthcare administration.  He continues to run assisted living facility.  He is married and busy with his family.  He has not been exercising and his weight is in increased significantly over the last few years.  He has never had a colonoscopy.  He denies any abdominal pain changes in his stooling patterns blood in his stools.  Denies any problems with urination.  He is not currently exercising but he and his wife have recently purchased a [employer] bike.  Past Medical History:  Diagnosis Date  . Asthma   . Chicken pox     Past Surgical History:  Procedure Laterality Date  . KNEE ARTHROSCOPY  1999    Family History  Problem Relation Age of Onset  . Hyperlipidemia Mother   . Asthma Father   . Cancer Father   . Drug abuse Father   . Hearing loss Father   . Heart disease Father   . Heart attack Father   . Hypertension Father     Social History   Socioeconomic History  . Marital status: Married    Spouse name: Not on file  . Number of children: Not on file  . Years of education: Not on file  . Highest education level: Not on file  Occupational History  . Not on file  Tobacco Use  . Smoking status: Never Smoker  . Smokeless tobacco: Never Used  Vaping Use  . Vaping Use: Never used  Substance and Sexual Activity  . Alcohol use: Yes    Alcohol/week: 4.0 standard drinks    Types: 4 Cans of beer per week  . Drug use: No  . Sexual activity: Yes    Partners: Female  Other Topics Concern  . Not on file  Social History  Narrative  . Not on file   Social Determinants of Health   Financial Resource Strain:   . Difficulty of Paying Living Expenses: Not on file  Food Insecurity:   . Worried About Charity fundraiser in the Last Year: Not on file  . Ran Out of Food in the Last Year: Not on file  Transportation Needs:   . Lack of Transportation (Medical): Not on file  . Lack of Transportation (Non-Medical): Not on file  Physical Activity:   . Days of Exercise per Week: Not on file  . Minutes of Exercise per Session: Not on file  Stress:   . Feeling of Stress : Not on file  Social Connections:   . Frequency of Communication with Friends and Family: Not on file  . Frequency of Social Gatherings with Friends and Family: Not on file  . Attends Religious Services: Not on file  . Active Member of Clubs or Organizations: Not on file  . Attends Archivist Meetings: Not on file  . Marital Status: Not on file  Intimate Partner Violence:   . Fear of Current or Ex-Partner: Not on file  . Emotionally Abused: Not on file  .  Physically Abused: Not on file  . Sexually Abused: Not on file    Outpatient Medications Prior to Visit  Medication Sig Dispense Refill  . atorvastatin (LIPITOR) 20 MG tablet Take 1 tablet (20 mg total) by mouth daily. 30 tablet 0   No facility-administered medications prior to visit.    Allergies  Allergen Reactions  . Crestor [Rosuvastatin Calcium]     Headache    ROS Review of Systems  Constitutional: Positive for fatigue. Negative for diaphoresis, fever and unexpected weight change.  HENT: Negative.   Eyes: Negative for photophobia and visual disturbance.  Respiratory: Negative.   Cardiovascular: Negative.   Gastrointestinal: Negative.  Negative for anal bleeding, blood in stool and constipation.  Endocrine: Negative for polyphagia and polyuria.  Genitourinary: Negative for difficulty urinating, frequency and urgency.  Musculoskeletal: Negative.   Skin: Negative  for pallor and rash.  Allergic/Immunologic: Negative for immunocompromised state.  Neurological: Negative for light-headedness and headaches.  Hematological: Does not bruise/bleed easily.  Psychiatric/Behavioral: Negative.       Objective:    Physical Exam Vitals reviewed.  Constitutional:      General: He is not in acute distress.    Appearance: Normal appearance. He is obese. He is not ill-appearing, toxic-appearing or diaphoretic.  HENT:     Head: Normocephalic and atraumatic.     Right Ear: Tympanic membrane, ear canal and external ear normal.     Left Ear: Tympanic membrane, ear canal and external ear normal.     Mouth/Throat:     Mouth: Mucous membranes are moist.     Pharynx: Oropharynx is clear. No oropharyngeal exudate or posterior oropharyngeal erythema.  Eyes:     General: No scleral icterus.       Right eye: No discharge.        Left eye: No discharge.     Extraocular Movements: Extraocular movements intact.     Conjunctiva/sclera: Conjunctivae normal.     Pupils: Pupils are equal, round, and reactive to light.  Cardiovascular:     Rate and Rhythm: Normal rate and regular rhythm.  Pulmonary:     Effort: Pulmonary effort is normal.     Breath sounds: Normal breath sounds.  Abdominal:     General: Abdomen is flat. Bowel sounds are normal. There is no distension.     Palpations: Abdomen is soft. There is no mass.     Tenderness: There is no abdominal tenderness. There is no guarding or rebound.     Hernia: No hernia is present.  Genitourinary:    Prostate: Enlarged. Not tender and no nodules present.     Rectum: Guaiac result negative. No mass, tenderness, anal fissure, external hemorrhoid or internal hemorrhoid. Normal anal tone.  Musculoskeletal:     Cervical back: Normal range of motion. No rigidity or tenderness.     Right lower leg: No edema.     Left lower leg: No edema.  Lymphadenopathy:     Cervical: No cervical adenopathy.  Skin:    General: Skin is  warm and dry.  Neurological:     Mental Status: He is alert and oriented to person, place, and time.  Psychiatric:        Mood and Affect: Mood normal.        Behavior: Behavior normal.     BP 138/82   Pulse 81   Temp 97.6 F (36.4 C) (Tympanic)   Ht 6\' 2"  (1.88 m)   Wt 300 lb (136.1 kg)   SpO2 96%  BMI 38.52 kg/m  Wt Readings from Last 3 Encounters:  05/29/20 300 lb (136.1 kg)  02/27/19 270 lb (122.5 kg)  08/28/18 281 lb 8 oz (127.7 kg)     Health Maintenance Due  Topic Date Due  . Hepatitis C Screening  Never done  . HIV Screening  Never done  . COLONOSCOPY  Never done    There are no preventive care reminders to display for this patient.  Lab Results  Component Value Date   TSH 2.39 05/29/2020   Lab Results  Component Value Date   WBC 6.0 05/29/2020   HGB 14.8 05/29/2020   HCT 44.3 05/29/2020   MCV 89.8 05/29/2020   PLT 235.0 05/29/2020   Lab Results  Component Value Date   NA 139 05/29/2020   K 3.8 05/29/2020   CO2 30 05/29/2020   GLUCOSE 95 05/29/2020   BUN 20 05/29/2020   CREATININE 1.32 05/29/2020   BILITOT 0.4 05/29/2020   ALKPHOS 57 05/29/2020   AST 33 05/29/2020   ALT 60 (H) 05/29/2020   PROT 7.5 05/29/2020   ALBUMIN 4.5 05/29/2020   CALCIUM 9.7 05/29/2020   GFR 69.38 05/29/2020   Lab Results  Component Value Date   CHOL 180 05/29/2020   Lab Results  Component Value Date   HDL 48.50 05/29/2020   Lab Results  Component Value Date   LDLCALC 110 (H) 05/29/2020   Lab Results  Component Value Date   TRIG 110.0 05/29/2020   Lab Results  Component Value Date   CHOLHDL 4 05/29/2020   No results found for: HGBA1C    Assessment & Plan:   Problem List Items Addressed This Visit      Other   Healthcare maintenance   Relevant Orders   CBC (Completed)   Comprehensive metabolic panel (Completed)   PSA (Completed)   Urinalysis, Routine w reflex microscopic (Completed)   Ambulatory referral to Gastroenterology   Class 2  obesity due to excess calories with body mass index (BMI) of 38.0 to 38.9 in adult   Relevant Orders   Amb Ref to Medical Weight Management   Elevated LDL cholesterol level   Relevant Medications   atorvastatin (LIPITOR) 40 MG tablet   Other Relevant Orders   LDL cholesterol, direct (Completed)   Lipid panel (Completed)   Need for influenza vaccination - Primary   Relevant Orders   Flu Vaccine QUAD 6+ mos PF IM (Fluarix Quad PF) (Completed)   Fatigue   Relevant Orders   CBC (Completed)   TSH (Completed)   Elevated ALT measurement   Relevant Orders   Hepatitis C antibody   HIV Antibody (routine testing w rflx)   Hepatitis B Surface AntiGEN      Meds ordered this encounter  Medications  . atorvastatin (LIPITOR) 40 MG tablet    Sig: Take 1 tablet (40 mg total) by mouth daily.    Dispense:  90 tablet    Refill:  3    Follow-up: Return in about 6 months (around 11/26/2020).   Given information on health maintenance and disease prevention.  He was also given information on BMI and obesity.  Information was given on preventing high cholesterol.  He agrees to give weight loss management a try. Libby Maw, MD

## 2020-05-29 NOTE — Patient Instructions (Addendum)
BMI for Adults What is BMI? Body mass index (BMI) is a number that is calculated from a person's weight and height. BMI can help estimate how much of a person's weight is composed of fat. BMI does not measure body fat directly. Rather, it is an alternative to procedures that directly measure body fat, which can be difficult and expensive. BMI can help identify people who may be at higher risk for certain medical problems. What are BMI measurements used for? BMI is used as a screening tool to identify possible weight problems. It helps determine whether a person is obese, overweight, a healthy weight, or underweight. BMI is useful for:  Identifying a weight problem that may be related to a medical condition or may increase the risk for medical problems.  Promoting changes, such as changes in diet and exercise, to help reach a healthy weight. BMI screening can be repeated to see if these changes are working. How is BMI calculated? BMI involves measuring your weight in relation to your height. Both height and weight are measured, and the BMI is calculated from those numbers. This can be done either in [place] ([place] or metric measurements. Note that charts and online BMI calculators are available to help you find your BMI quickly and easily without having to do these calculations yourself. To calculate your BMI in [place] ([place] measurements:  1. Measure your weight in pounds (lb). 2. Multiply the number of pounds by 703. ? For example, for a person who weighs 180 lb, multiply that number by 703, which equals 126,540. 3. Measure your height in inches. Then multiply that number by itself to get a measurement called "inches squared." ? For example, for a person who is 70 inches tall, the "inches squared" measurement is 70 inches x 70 inches, which equals 4,900 inches squared. 4. Divide the total from step 2 (number of lb x 703) by the total from step 3 (inches squared): 126,540  4,900 = 25.8. This is  your BMI. To calculate your BMI in metric measurements: 1. Measure your weight in kilograms (kg). 2. Measure your height in meters (m). Then multiply that number by itself to get a measurement called "meters squared." ? For example, for a person who is 1.75 m tall, the "meters squared" measurement is 1.75 m x 1.75 m, which is equal to 3.1 meters squared. 3. Divide the number of kilograms (your weight) by the meters squared number. In this example: 70  3.1 = 22.6. This is your BMI. What do the results mean? BMI charts are used to identify whether you are underweight, normal weight, overweight, or obese. The following guidelines will be used:  Underweight: BMI less than 18.5.  Normal weight: BMI between 18.5 and 24.9.  Overweight: BMI between 25 and 29.9.  Obese: BMI of 30 or above. Keep these notes in mind:  Weight includes both fat and muscle, so someone with a muscular build, such as an athlete, may have a BMI that is higher than 24.9. In cases like these, BMI is not an accurate measure of body fat.  To determine if excess body fat is the cause of a BMI of 25 or higher, further assessments may need to be done by a health care provider.  BMI is usually interpreted in the same way for men and women. Where to find more information For more information about BMI, including tools to quickly calculate your BMI, go to these websites:  Centers for Disease Control and Prevention: http://www.wolf.info/  American  Heart Association: www.heart.org  National Heart, Lung, and Blood Institute: https://wilson-eaton.com/ Summary  Body mass index (BMI) is a number that is calculated from a person's weight and height.  BMI may help estimate how much of a person's weight is composed of fat. BMI can help identify those who may be at higher risk for certain medical problems.  BMI can be measured using English measurements or metric measurements.  BMI charts are used to identify whether you are underweight, normal  weight, overweight, or obese. This information is not intended to replace advice given to you by your health care provider. Make sure you discuss any questions you have with your health care provider. Document Revised: 05/22/2019 Document Reviewed: 03/29/2019 Elsevier Patient Education  [place].  Obesity, Adult Obesity is the condition of having too much total body fat. Being overweight or obese means that your weight is greater than what is considered healthy for your body size. Obesity is determined by a measurement called BMI. BMI is an estimate of body fat and is calculated from height and weight. For adults, a BMI of 30 or higher is considered obese. Obesity can lead to other health concerns and major illnesses, including:  Stroke.  Coronary artery disease (CAD).  Type 2 diabetes.  Some types of cancer, including cancers of the colon, breast, uterus, and gallbladder.  Osteoarthritis.  High blood pressure (hypertension).  High cholesterol.  Sleep apnea.  Gallbladder stones.  Infertility problems. What are the causes? Common causes of this condition include:  Eating daily meals that are high in calories, sugar, and fat.  Being born with genes that may make you more likely to become obese.  Having a medical condition that causes obesity, including: ? Hypothyroidism. ? Polycystic ovarian syndrome (PCOS). ? Binge-eating disorder. ? Cushing syndrome.  Taking certain medicines, such as steroids, antidepressants, and seizure medicines.  Not being physically active (sedentary lifestyle).  Not getting enough sleep.  Drinking high amounts of sugar-sweetened beverages, such as soft drinks. What increases the risk? The following factors may make you more likely to develop this condition:  Having a family history of obesity.  Being a woman of African American descent.  Being a man of Hispanic descent.  Living in an area with limited access to: ? [place],  recreation centers, or sidewalks. ? Healthy food choices, such as grocery stores and farmers' markets. What are the signs or symptoms? The main sign of this condition is having too much body fat. How is this diagnosed? This condition is diagnosed based on:  Your BMI. If you are an adult with a BMI of 30 or higher, you are considered obese.  Your waist circumference. This measures the distance around your waistline.  Your skinfold thickness. Your health care provider may gently pinch a fold of your skin and measure it. You may have other tests to check for underlying conditions. How is this treated? Treatment for this condition often includes changing your lifestyle. Treatment may include some or all of the following:  Dietary changes. This may include developing a healthy meal plan.  Regular physical activity. This may include activity that causes your heart to beat faster (aerobic exercise) and strength training. Work with your health care provider to design an exercise program that works for you.  Medicine to help you lose weight if you are unable to lose 1 pound a week after 6 weeks of healthy eating and more physical activity.  Treating conditions that cause the obesity (underlying conditions).  Surgery.  Surgical options may include gastric banding and gastric bypass. Surgery may be done if: ? Other treatments have not helped to improve your condition. ? You have a BMI of 40 or higher. ? You have life-threatening health problems related to obesity. Follow these instructions at home: Eating and drinking   Follow recommendations from your health care provider about what you eat and drink. Your health care provider may advise you to: ? Limit fast food, sweets, and processed snack foods. ? Choose low-fat options, such as low-fat milk instead of whole milk. ? Eat 5 or more servings of fruits or vegetables every day. ? Eat at home more often. This gives you more control over what  you eat. ? Choose healthy foods when you eat out. ? Learn to read food labels. This will help you understand how much food is considered 1 serving. ? Learn what a healthy serving size is. ? Keep low-fat snacks available. ? Limit sugary drinks, such as soda, fruit juice, sweetened iced tea, and flavored milk.  Drink enough water to keep your urine pale yellow.  Do not follow a fad diet. Fad diets can be unhealthy and even dangerous. Physical activity  Exercise regularly, as told by your health care provider. ? Most adults should get up to 150 minutes of moderate-intensity exercise every week. ? Ask your health care provider what types of exercise are safe for you and how often you should exercise.  Warm up and stretch before being active.  Cool down and stretch after being active.  Rest between periods of activity. Lifestyle  Work with your health care provider and a dietitian to set a weight-loss goal that is healthy and reasonable for you.  Limit your screen time.  Find ways to reward yourself that do not involve food.  Do not drink alcohol if: ? Your health care provider tells you not to drink. ? You are pregnant, may be pregnant, or are planning to become pregnant.  If you drink alcohol: ? Limit how much you use to:  0-1 drink a day for women.  0-2 drinks a day for men. ? Be aware of how much alcohol is in your drink. In the [place]., one drink equals one 12 oz bottle of beer (355 mL), one 5 oz glass of wine (148 mL), or one 1 oz glass of hard liquor (44 mL). General instructions  Keep a weight-loss journal to keep track of the food you eat and how much exercise you get.  Take over-the-counter and prescription medicines only as told by your health care provider.  Take vitamins and supplements only as told by your health care provider.  Consider joining a support group. Your health care provider may be able to recommend a support group.  Keep all follow-up visits as  told by your health care provider. This is important. Contact a health care provider if:  You are unable to meet your weight loss goal after 6 weeks of dietary and lifestyle changes. Get help right away if you are having:  Trouble breathing.  Suicidal thoughts or behaviors. Summary  Obesity is the condition of having too much total body fat.  Being overweight or obese means that your weight is greater than what is considered healthy for your body size.  Work with your health care provider and a dietitian to set a weight-loss goal that is healthy and reasonable for you.  Exercise regularly, as told by your health care provider. Ask your health care provider what types  of exercise are safe for you and how often you should exercise. This information is not intended to replace advice given to you by your health care provider. Make sure you discuss any questions you have with your health care provider. Document Revised: 05/03/2018 Document Reviewed: 05/03/2018 Elsevier Patient Education  2020 Elsevier Inc.  Preventive Care 9-51 Years Old, Male Preventive care refers to lifestyle choices and visits with your health care provider that can promote health and wellness. This includes:  A yearly physical exam. This is also called an annual well check.  Regular dental and eye exams.  Immunizations.  Screening for certain conditions.  Healthy lifestyle choices, such as eating a healthy diet, getting regular exercise, not using drugs or products that contain nicotine and tobacco, and limiting alcohol use. What can I expect for my preventive care visit? Physical exam Your health care provider will check:  Height and weight. These may be used to calculate body mass index (BMI), which is a measurement that tells if you are at a healthy weight.  Heart rate and blood pressure.  Your skin for abnormal spots. Counseling Your health care provider may ask you questions about:  Alcohol,  tobacco, and drug use.  Emotional well-being.  Home and relationship well-being.  Sexual activity.  Eating habits.  Work and work Statistician. What immunizations do I need?  Influenza (flu) vaccine  This is recommended every year. Tetanus, diphtheria, and pertussis (Tdap) vaccine  You may need a Td booster every 10 years. Varicella (chickenpox) vaccine  You may need this vaccine if you have not already been vaccinated. Zoster (shingles) vaccine  You may need this after age 59. Measles, mumps, and rubella (MMR) vaccine  You may need at least one dose of MMR if you were born in 1957 or later. You may also need a second dose. Pneumococcal conjugate (PCV13) vaccine  You may need this if you have certain conditions and were not previously vaccinated. Pneumococcal polysaccharide (PPSV23) vaccine  You may need one or two doses if you smoke cigarettes or if you have certain conditions. Meningococcal conjugate (MenACWY) vaccine  You may need this if you have certain conditions. Hepatitis A vaccine  You may need this if you have certain conditions or if you travel or work in places where you may be exposed to hepatitis A. Hepatitis B vaccine  You may need this if you have certain conditions or if you travel or work in places where you may be exposed to hepatitis B. Haemophilus influenzae type b (Hib) vaccine  You may need this if you have certain risk factors. Human papillomavirus (HPV) vaccine  If recommended by your health care provider, you may need three doses over 6 months. You may receive vaccines as individual doses or as more than one vaccine together in one shot (combination vaccines). Talk with your health care provider about the risks and benefits of combination vaccines. What tests do I need? Blood tests  Lipid and cholesterol levels. These may be checked every 5 years, or more frequently if you are over 90 years old.  Hepatitis C test.  Hepatitis B  test. Screening  Lung cancer screening. You may have this screening every year starting at age 26 if you have a 30-pack-year history of smoking and currently smoke or have quit within the past 15 years.  Prostate cancer screening. Recommendations will vary depending on your family history and other risks.  Colorectal cancer screening. All adults should have this screening starting at age  57 and continuing until age 51. Your health care provider may recommend screening at age 2 if you are at increased risk. You will have tests every 1-10 years, depending on your results and the type of screening test.  Diabetes screening. This is done by checking your blood sugar (glucose) after you have not eaten for a while (fasting). You may have this done every 1-3 years.  Sexually transmitted disease (STD) testing. Follow these instructions at home: Eating and drinking  Eat a diet that includes fresh fruits and vegetables, whole grains, lean protein, and low-fat dairy products.  Take vitamin and mineral supplements as recommended by your health care provider.  Do not drink alcohol if your health care provider tells you not to drink.  If you drink alcohol: ? Limit how much you have to 0-2 drinks a day. ? Be aware of how much alcohol is in your drink. In the [place] one drink equals one 12 oz bottle of beer (355 mL), one 5 oz glass of wine (148 mL), or one 1 oz glass of hard liquor (44 mL). Lifestyle  Take daily care of your teeth and gums.  Stay active. Exercise for at least 30 minutes on 5 or more days each week.  Do not use any products that contain nicotine or tobacco, such as cigarettes, e-cigarettes, and chewing tobacco. If you need help quitting, ask your health care provider.  If you are sexually active, practice safe sex. Use a condom or other form of protection to prevent STIs (sexually transmitted infections).  Talk with your health care provider about taking a low-dose aspirin every  day starting at age 88. What's next?  Go to your health care provider once a year for a well check visit.  Ask your health care provider how often you should have your eyes and teeth checked.  Stay up to date on all vaccines. This information is not intended to replace advice given to you by your health care provider. Make sure you discuss any questions you have with your health care provider. Document Revised: 08/23/2018 Document Reviewed: 08/23/2018 Elsevier Patient Education  2020 [place] Maintenance, Male Adopting a healthy lifestyle and getting preventive care are important in promoting health and wellness. Ask your health care provider about:  The right schedule for you to have regular tests and exams.  Things you can do on your own to prevent diseases and keep yourself healthy. What should I know about diet, weight, and exercise? Eat a healthy diet   Eat a diet that includes plenty of vegetables, fruits, low-fat dairy products, and lean protein.  Do not eat a lot of foods that are high in solid fats, added sugars, or sodium. Maintain a healthy weight Body mass index (BMI) is a measurement that can be used to identify possible weight problems. It estimates body fat based on height and weight. Your health care provider can help determine your BMI and help you achieve or maintain a healthy weight. Get regular exercise Get regular exercise. This is one of the most important things you can do for your health. Most adults should:  Exercise for at least 150 minutes each week. The exercise should increase your heart rate and make you sweat (moderate-intensity exercise).  Do strengthening exercises at least twice a week. This is in addition to the moderate-intensity exercise.  Spend less time sitting. Even light physical activity can be beneficial. Watch cholesterol and blood lipids Have your blood tested for lipids and cholesterol  at 50 years of age, then have this  test every 5 years. You may need to have your cholesterol levels checked more often if:  Your lipid or cholesterol levels are high.  You are older than 50 years of age.  You are at high risk for heart disease. What should I know about cancer screening? Many types of cancers can be detected early and may often be prevented. Depending on your health history and family history, you may need to have cancer screening at various ages. This may include screening for:  Colorectal cancer.  Prostate cancer.  Skin cancer.  Lung cancer. What should I know about heart disease, diabetes, and high blood pressure? Blood pressure and heart disease  High blood pressure causes heart disease and increases the risk of stroke. This is more likely to develop in people who have high blood pressure readings, are of African descent, or are overweight.  Talk with your health care provider about your target blood pressure readings.  Have your blood pressure checked: ? Every 3-5 years if you are 30-23 years of age. ? Every year if you are 11 years old or older.  If you are between the ages of 43 and 44 and are a current or former smoker, ask your health care provider if you should have a one-time screening for abdominal aortic aneurysm (AAA). Diabetes Have regular diabetes screenings. This checks your fasting blood sugar level. Have the screening done:  Once every three years after age 64 if you are at a normal weight and have a low risk for diabetes.  More often and at a younger age if you are overweight or have a high risk for diabetes. What should I know about preventing infection? Hepatitis B If you have a higher risk for hepatitis B, you should be screened for this virus. Talk with your health care provider to find out if you are at risk for hepatitis B infection. Hepatitis C Blood testing is recommended for:  Everyone born from [birth year] through 1966.  Anyone with known risk factors for hepatitis  C. Sexually transmitted infections (STIs)  You should be screened each year for STIs, including gonorrhea and chlamydia, if: ? You are sexually active and are younger than 50 years of age. ? You are older than 50 years of age and your health care provider tells you that you are at risk for this type of infection. ? Your sexual activity has changed since you were last screened, and you are at increased risk for chlamydia or gonorrhea. Ask your health care provider if you are at risk.  Ask your health care provider about whether you are at high risk for HIV. Your health care provider may recommend a prescription medicine to help prevent HIV infection. If you choose to take medicine to prevent HIV, you should first get tested for HIV. You should then be tested every 3 months for as long as you are taking the medicine. Follow these instructions at home: Lifestyle  Do not use any products that contain nicotine or tobacco, such as cigarettes, e-cigarettes, and chewing tobacco. If you need help quitting, ask your health care provider.  Do not use street drugs.  Do not share needles.  Ask your health care provider for help if you need support or information about quitting drugs. Alcohol use  Do not drink alcohol if your health care provider tells you not to drink.  If you drink alcohol: ? Limit how much you have to 0-2 drinks  a day. ? Be aware of how much alcohol is in your drink. In the [place] one drink equals one 12 oz bottle of beer (355 mL), one 5 oz glass of wine (148 mL), or one 1 oz glass of hard liquor (44 mL). General instructions  Schedule regular health, dental, and eye exams.  Stay current with your vaccines.  Tell your health care provider if: ? You often feel depressed. ? You have ever been abused or do not feel safe at home. Summary  Adopting a healthy lifestyle and getting preventive care are important in promoting health and wellness.  Follow your health care  provider's instructions about healthy diet, exercising, and getting tested or screened for diseases.  Follow your health care provider's instructions on monitoring your cholesterol and blood pressure. This information is not intended to replace advice given to you by your health care provider. Make sure you discuss any questions you have with your health care provider. Document Revised: 08/22/2018 Document Reviewed: 08/22/2018 Elsevier Patient Education  2020 [place].  Preventing High Cholesterol Cholesterol is a white, waxy substance similar to fat that the human body needs to help build cells. The liver makes all the cholesterol that a person's body needs. Having high cholesterol (hypercholesterolemia) increases a person's risk for heart disease and stroke. Extra (excess) cholesterol comes from the food the person eats. High cholesterol can often be prevented with diet and lifestyle changes. If you already have high cholesterol, you can control it with diet and lifestyle changes and with medicine. How can high cholesterol affect me? If you have high cholesterol, deposits (plaques) may build up on the walls of your arteries. The arteries are the blood vessels that carry blood away from your heart. Plaques make the arteries narrower and stiffer. This can limit or block blood flow and cause blood clots to form. Blood clots:  Are tiny balls of cells that form in your blood.  Can move to the heart or brain, causing a heart attack or stroke. Plaques in arteries greatly increase your risk for heart attack and stroke.Making diet and lifestyle changes can reduce your risk for these conditions that may threaten your life. What can increase my risk? This condition is more likely to develop in people who:  Eat foods that are high in saturated fat or cholesterol. Saturated fat is mostly found in: ? Foods that contain animal fat, such as red meat and some dairy products. ? Certain fatty foods made  from plants, such as tropical oils.  Are overweight.  Are not getting enough exercise.  Have a family history of high cholesterol. What actions can I take to prevent this? Nutrition   Eat less saturated fat.  Avoid trans fats (partially hydrogenated oils). These are often found in margarine and in some baked goods, fried foods, and snacks bought in packages.  Avoid precooked or cured meat, such as sausages or meat loaves.  Avoid foods and drinks that have added sugars.  Eat more fruits, vegetables, and whole grains.  Choose healthy sources of protein, such as fish, poultry, lean cuts of red meat, beans, peas, lentils, and nuts.  Choose healthy sources of fat, such as: ? Nuts. ? Vegetable oils, especially olive oil. ? Fish that have healthy fats (omega-3 fatty acids), such as mackerel or salmon. The items listed above may not be a complete list of recommended foods and beverages. Contact a dietitian for more information. Lifestyle  Lose weight if you are overweight. Losing 5-10 lb (2.3-4.5  kg) can help prevent or control high cholesterol. It can also lower your risk for diabetes and high blood pressure. Ask your health care provider to help you with a diet and exercise plan to lose weight safely.  Do not use any products that contain nicotine or tobacco, such as cigarettes, e-cigarettes, and chewing tobacco. If you need help quitting, ask your health care provider.  Limit your alcohol intake. ? Do not drink alcohol if:  Your health care provider tells you not to drink.  You are pregnant, may be pregnant, or are planning to become pregnant. ? If you drink alcohol:  Limit how much you use to:  0-1 drink a day for women.  0-2 drinks a day for men.  Be aware of how much alcohol is in your drink. In the [place] one drink equals one 12 oz bottle of beer (355 mL), one 5 oz glass of wine (148 mL), or one 1 oz glass of hard liquor (44 mL). Activity   Get enough exercise. Each  week, do at least 150 minutes of exercise that takes a medium level of effort (moderate-intensity exercise). ? This is exercise that:  Makes your heart beat faster and makes you breathe harder than usual.  Allows you to still be able to talk. ? You could exercise in short sessions several times a day or longer sessions a few times a week. For example, on 5 days each week, you could walk fast or ride your bike 3 times a day for 10 minutes each time.  Do exercises as told by your health care provider. Medicines  In addition to diet and lifestyle changes, your health care provider may recommend medicines to help lower cholesterol. This may be a medicine to lower the amount of cholesterol your liver makes. You may need medicine if: ? Diet and lifestyle changes do not lower your cholesterol enough. ? You have high cholesterol and other risk factors for heart disease or stroke.  Take over-the-counter and prescription medicines only as told by your health care provider. General information  Manage your risk factors for high cholesterol. Talk with your health care provider about all your risk factors and how to lower your risk.  Manage other conditions that you have, such as diabetes or high blood pressure (hypertension).  Have blood tests to check your cholesterol levels at regular points in time as told by your health care provider.  Keep all follow-up visits as told by your health care provider. This is important. Where to find more information  American Heart Association: www.heart.org  National Heart, Lung, and Blood Institute: https://wilson-eaton.com/ Summary  High cholesterol increases your risk for heart disease and stroke. By keeping your cholesterol level low, you can reduce your risk for these conditions.  High cholesterol can often be prevented with diet and lifestyle changes.  Work with your health care provider to manage your risk factors, and have your blood tested  regularly. This information is not intended to replace advice given to you by your health care provider. Make sure you discuss any questions you have with your health care provider. Document Revised: 12/21/2018 Document Reviewed: 05/07/2016 Elsevier Patient Education  2020 [employer].

## 2020-05-30 DIAGNOSIS — R7401 Elevation of levels of liver transaminase levels: Secondary | ICD-10-CM | POA: Insufficient documentation

## 2020-05-30 DIAGNOSIS — R5383 Other fatigue: Secondary | ICD-10-CM | POA: Insufficient documentation

## 2020-05-30 MED ORDER — ATORVASTATIN CALCIUM 40 MG PO TABS: 40.0000 mg | ORAL_TABLET | Freq: Every day | ORAL | 3 refills | Status: DC

## 2020-05-30 NOTE — Progress Notes (Signed)
Ldl cholesterol has improved but remains a little high. Would like increase atorvastatin. Please try the higher dose and let me know if there are problems. Planned weight loss would help.  One of the liver enzymes was mildly elevated. I have ordered additional blood work to check for hepatitis. These are screening labs that we should do anyway. Don't think its hepatitis but we should check. Need to return for another blood draw. At the end of the day, I think that it has a lot to do with the weight. Weight loss can help. We will follow for now.   Remainder of labs were okay.

## 2020-06-04 ENCOUNTER — Other Ambulatory Visit (INDEPENDENT_AMBULATORY_CARE_PROVIDER_SITE_OTHER): Payer: 59

## 2020-06-04 ENCOUNTER — Other Ambulatory Visit: Payer: Self-pay

## 2020-06-04 DIAGNOSIS — R7401 Elevation of levels of liver transaminase levels: Secondary | ICD-10-CM

## 2020-06-05 LAB — HEPATITIS C ANTIBODY
Hepatitis C Ab: NONREACTIVE
SIGNAL TO CUT-OFF: 0.02 (ref <1.00)

## 2020-06-05 LAB — HEPATITIS B SURFACE ANTIGEN: Hepatitis B Surface Ag: NONREACTIVE

## 2020-06-05 LAB — HIV ANTIBODY (ROUTINE TESTING W REFLEX): HIV 1&2 Ab, 4th Generation: NONREACTIVE

## 2020-06-09 MED FILL — ATORVASTATIN 40 MG TABLET: 40 | 90 days supply | Qty: 90 | Fill #0

## 2020-06-16 ENCOUNTER — Ambulatory Visit (INDEPENDENT_AMBULATORY_CARE_PROVIDER_SITE_OTHER): Payer: 59 | Admitting: Family Medicine

## 2020-06-16 ENCOUNTER — Other Ambulatory Visit: Payer: Self-pay

## 2020-06-16 ENCOUNTER — Encounter (INDEPENDENT_AMBULATORY_CARE_PROVIDER_SITE_OTHER): Payer: Self-pay | Admitting: Family Medicine

## 2020-06-16 VITALS — BP 131/85 | HR 85 | Temp 98.4°F | Ht 74.0 in | Wt 290.0 lb

## 2020-06-16 DIAGNOSIS — R0602 Shortness of breath: Secondary | ICD-10-CM | POA: Diagnosis not present

## 2020-06-16 DIAGNOSIS — Z1331 Encounter for screening for depression: Secondary | ICD-10-CM | POA: Diagnosis not present

## 2020-06-16 DIAGNOSIS — Z6837 Body mass index (BMI) 37.0-37.9, adult: Secondary | ICD-10-CM

## 2020-06-16 DIAGNOSIS — Z9189 Other specified personal risk factors, not elsewhere classified: Secondary | ICD-10-CM

## 2020-06-16 DIAGNOSIS — E66812 Obesity, class 2: Secondary | ICD-10-CM

## 2020-06-16 DIAGNOSIS — R400 Somnolence: Secondary | ICD-10-CM

## 2020-06-16 DIAGNOSIS — E7849 Other hyperlipidemia: Secondary | ICD-10-CM | POA: Diagnosis not present

## 2020-06-16 DIAGNOSIS — R5383 Other fatigue: Secondary | ICD-10-CM | POA: Diagnosis not present

## 2020-06-16 DIAGNOSIS — Z0289 Encounter for other administrative examinations: Secondary | ICD-10-CM

## 2020-06-17 LAB — T4, FREE: Free T4: 1.06 ng/dL (ref 0.82–1.77)

## 2020-06-17 LAB — T3: T3, Total: 117 ng/dL (ref 71–180)

## 2020-06-17 LAB — VITAMIN B12: Vitamin B-12: 491 pg/mL (ref 232–1245)

## 2020-06-17 LAB — FOLATE: Folate: 13.8 ng/mL (ref >3.0)

## 2020-06-17 LAB — HEMOGLOBIN A1C
Est. average glucose Bld gHb Est-mCnc: 120 mg/dL
Hgb A1c MFr Bld: 5.8 % — ABNORMAL HIGH (ref 4.8–5.6)

## 2020-06-17 LAB — INSULIN, RANDOM: INSULIN: 29 u[IU]/mL — ABNORMAL HIGH (ref 2.6–24.9)

## 2020-06-17 LAB — VITAMIN D 25 HYDROXY (VIT D DEFICIENCY, FRACTURES): Vit D, 25-Hydroxy: 31 ng/mL (ref 30.0–100.0)

## 2020-06-17 NOTE — Progress Notes (Signed)
Dear Dr. Ethelene Gillespie,   Thank you for referring Steven Gillespie to our clinic. The following note includes my evaluation and treatment recommendations.  Chief Complaint:   [name] (MR# [id number]) is a 49 y.o. male who presents for evaluation and treatment of obesity and related comorbidities. Current BMI is Body mass index is 37.23 kg/m. [name] has been struggling with his weight for many years and has been unsuccessful in either losing weight, maintaining weight loss, or reaching his healthy weight goal.  [name] is currently in the action stage of change and ready to dedicate time achieving and maintaining a healthier weight. [name] is interested in becoming our patient and working on intensive lifestyle modifications including (but not limited to) diet and exercise for weight loss.  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  He lives with his wife Steven Gillespie, who is 77 years old, and his two sons, age 57 and 66.  Eats out 4+ days per week.  Snacks on nuts and skips breakfast most days.  Drinks caloric beverages daily.  [name]'s habits were reviewed today and are as follows: His family eats meals together, his desired weight loss is 67 pounds, he started gaining weight at age 68, his heaviest weight ever was 292 pounds, he skips breakfast frequently, he is frequently drinking liquids with calories, he frequently makes poor food choices, he frequently eats larger portions than normal and he struggles with emotional eating.  This is the patient's first visit at [facility].  The patient's NEW PATIENT PACKET that they filled out prior to today's office visit was reviewed at length and some information from that paperwork was also included within the following office visit note.    Included in the packet: current and past health history, medications, allergies, ROS, gynecologic history (women only), surgical history, family history, social history, weight history,  weight loss surgery history (for those that have had weight loss surgery), nutritional evaluation, mood and food questionnaire along with a depression screening (PHQ9) on all patients, an Epworth questionnaire, sleep habits questionnaire, patient life and health improvement goals questionnaire. These will all be scanned into the patient's chart under media.   During the visit, I independently reviewed the patient's EKG, bioimpedance scale results, and indirect calorimeter results. I used this information to tailor a meal plan for the patient that will help Steven Gillespie to lose weight and will improve his obesity-related conditions going forward. I performed a medically necessary appropriate examination and/or evaluation. I discussed the assessment and treatment plan with the patient. The patient was provided an opportunity to ask questions and all were answered. The patient agreed with the plan and demonstrated an understanding of the instructions. Labs were ordered at this visit and will be reviewed at the next visit unless more critical results need to be addressed immediately. Clinical information was updated and documented in the EMR.   Time spent on visit including pre-visit chart review and post-visit care was estimated to be 60+ minutes.    Depression Screen [name]'s Food and Mood (modified PHQ-9) score was 2.  Depression screen PHQ 2/9 06/16/2020  Decreased Interest 0  Down, Depressed, Hopeless 0  PHQ - 2 Score 0  Altered sleeping 0  Tired, decreased energy 1  Change in appetite 1  Feeling bad or failure about yourself  0  Trouble concentrating 0  Moving slowly or fidgety/restless 0  Suicidal thoughts 0  PHQ-9 Score 2  Difficult doing work/chores -   Assessment/Plan:  1. Other fatigue Vinal admits to daytime somnolence and denies waking up still tired. Patent has a history of symptoms of daytime fatigue and snoring. Steven Gillespie generally gets 8 hours of sleep per night, and states that  he has generally restful sleep. Snoring is present. Apneic episodes are present. Epworth Sleepiness Score is 11.  Habib does feel that his weight is causing his energy to be lower than it should be. Fatigue may be related to obesity, depression or many other causes. Labs will be ordered, and in the meanwhile, Steven Gillespie will focus on self care including making healthy food choices, increasing physical activity and focusing on stress reduction.  - EKG 12-Lead - Vitamin B12 - Folate - T3 - VITAMIN D 25 Hydroxy (Vit-D Deficiency, Fractures) - T4, free  2. Shortness of breath on exertion Steven Gillespie notes increasing shortness of breath with exercising and seems to be worsening over time with weight gain. He notes getting out of breath sooner with activity than he used to. This has gotten worse recently. Steven Gillespie denies shortness of breath at rest or orthopnea.  Steven Gillespie does feel that he gets out of breath more easily that he used to when he exercises. Steven Gillespie's shortness of breath appears to be obesity related and exercise induced. He has agreed to work on weight loss and gradually increase exercise to treat his exercise induced shortness of breath. Will continue to monitor closely.  - Hemoglobin A1c - Insulin, random - T4, free  3. Other hyperlipidemia Steven Gillespie has hyperlipidemia and has been trying to improve his cholesterol levels with intensive lifestyle modification including a low saturated fat diet, exercise and weight loss. He denies any chest pain, claudication or myalgias.  He is taking Lipitor 40 mg daily and is tolerating it well.  Lab Results  Component Value Date   ALT 60 (H) 05/29/2020   AST 33 05/29/2020   ALKPHOS 57 05/29/2020   BILITOT 0.4 05/29/2020   Lab Results  Component Value Date   CHOL 180 05/29/2020   HDL 48.50 05/29/2020   LDLCALC 110 (H) 05/29/2020   LDLDIRECT 111.0 05/29/2020   TRIG 110.0 05/29/2020   CHOLHDL 4 05/29/2020   Cardiovascular risk and specific  lipid/LDL goals reviewed.  We discussed several lifestyle modifications today and [name] will continue to work on diet, exercise and weight loss efforts. Orders and follow up as documented in patient record.  Continue medication per PCP.  Recheck labs periodically as he loses weight and gets healthy.  Wean medication if possible.  Low saturated fat diet.  Counseling Intensive lifestyle modifications are the first line treatment for this issue. . Dietary changes: Increase soluble fiber. Decrease simple carbohydrates. . Exercise changes: Moderate to vigorous-intensity aerobic activity 150 minutes per week if tolerated. . Lipid-lowering medications: see documented in medical record.  4. Daytime sleepiness Epworth Sleepiness Score is 11, but he states that he feels rested and denies issues with sleep or concerns when asked.  Check labs.  Declines sleep referral.  Will let us know if it becomes an issue for him in the future.  5. Depression screening Orlandus was screened for depression as part of his new patient workup today.  PHQ-9 is 2.  Depression screen is negative.   6. At risk for obstructive sleep apnea Fredi is at increased risk for sleep apnea due to obesity.  The patient endorses the following symptoms: snoring and apneic events. Epworth score: 11.   Karo was given approximately 12 minutes of coronary artery disease prevention counseling today.  He is 49 y.o. male and has risk factors for obstructive sleep apnea including obesity. We discussed intensive lifestyle modifications today with an emphasis on specific weight loss instructions and strategies.  7. Class 2 severe obesity with serious comorbidity and body mass index (BMI) of 37.0 to 37.9 in adult, unspecified obesity type [facility])  Deshon is currently in the action stage of change and his goal is to continue with weight loss efforts. I recommend Johnpaul begin the structured treatment plan as follows:  He has agreed to the  Category 4 Plan.  Exercise goals: As is.   Behavioral modification strategies: increasing lean protein intake, decreasing liquid calories, no skipping meals, meal planning and cooking strategies, keeping healthy foods in the home and planning for success.  He was informed of the importance of frequent follow-up visits to maximize his success with intensive lifestyle modifications for his multiple health conditions. He was informed we would discuss his lab results at his next visit unless there is a critical issue that needs to be addressed sooner. Xayvion agreed to keep his next visit at the agreed upon time to discuss these results.  Objective:   Blood pressure 131/85, pulse 85, temperature 98.4 F (36.9 C), height 6\' 2"  (1.88 m), weight 290 lb (131.5 kg), SpO2 97 %. Body mass index is 37.23 kg/m.  EKG: Normal sinus rhythm, rate 78 bpm.  Indirect Calorimeter completed today shows a VO2 of 391 and a REE of 2723.  His calculated basal metabolic rate is 3474 thus his basal metabolic rate is better than expected.  General: Cooperative, alert, well developed, in no acute distress. HEENT: Conjunctivae and lids unremarkable. Cardiovascular: Regular rhythm.  Lungs: Normal work of breathing. Neurologic: No focal deficits.   Lab Results  Component Value Date   CREATININE 1.32 05/29/2020   BUN 20 05/29/2020   NA 139 05/29/2020   K 3.8 05/29/2020   CL 103 05/29/2020   CO2 30 05/29/2020   Lab Results  Component Value Date   ALT 60 (H) 05/29/2020   AST 33 05/29/2020   ALKPHOS 57 05/29/2020   BILITOT 0.4 05/29/2020   Lab Results  Component Value Date   HGBA1C 5.8 (H) 06/16/2020   Lab Results  Component Value Date   INSULIN 29.0 (H) 06/16/2020   Lab Results  Component Value Date   TSH 2.39 05/29/2020   Lab Results  Component Value Date   CHOL 180 05/29/2020   HDL 48.50 05/29/2020   LDLCALC 110 (H) 05/29/2020   LDLDIRECT 111.0 05/29/2020   TRIG 110.0 05/29/2020   CHOLHDL 4  05/29/2020   Lab Results  Component Value Date   WBC 6.0 05/29/2020   HGB 14.8 05/29/2020   HCT 44.3 05/29/2020   MCV 89.8 05/29/2020   PLT 235.0 05/29/2020   Attestation Statements:   Reviewed by clinician on day of visit: allergies, medications, problem list, medical history, surgical history, family history, social history, and previous encounter notes.  I, Water quality scientist, CMA, am acting as Location manager for [employer], DO.  I have reviewed the above documentation for accuracy and completeness, and I agree with the above. Mellody Dance, DO

## 2020-06-18 ENCOUNTER — Encounter: Payer: 59 | Admitting: Family Medicine

## 2020-06-30 ENCOUNTER — Other Ambulatory Visit (INDEPENDENT_AMBULATORY_CARE_PROVIDER_SITE_OTHER): Payer: Self-pay | Admitting: Family Medicine

## 2020-06-30 ENCOUNTER — Ambulatory Visit (INDEPENDENT_AMBULATORY_CARE_PROVIDER_SITE_OTHER): Payer: 59 | Admitting: Family Medicine

## 2020-06-30 ENCOUNTER — Encounter (INDEPENDENT_AMBULATORY_CARE_PROVIDER_SITE_OTHER): Payer: Self-pay | Admitting: Family Medicine

## 2020-06-30 ENCOUNTER — Other Ambulatory Visit: Payer: Self-pay

## 2020-06-30 VITALS — BP 137/75 | HR 63 | Temp 98.1°F | Ht 74.0 in | Wt 287.0 lb

## 2020-06-30 DIAGNOSIS — Z9189 Other specified personal risk factors, not elsewhere classified: Secondary | ICD-10-CM | POA: Diagnosis not present

## 2020-06-30 DIAGNOSIS — E559 Vitamin D deficiency, unspecified: Secondary | ICD-10-CM

## 2020-06-30 DIAGNOSIS — R7303 Prediabetes: Secondary | ICD-10-CM

## 2020-06-30 DIAGNOSIS — Z6837 Body mass index (BMI) 37.0-37.9, adult: Secondary | ICD-10-CM | POA: Diagnosis not present

## 2020-06-30 DIAGNOSIS — E7849 Other hyperlipidemia: Secondary | ICD-10-CM

## 2020-06-30 MED ORDER — VITAMIN D (ERGOCALCIFEROL) 1.25 MG (50000 UNIT) PO CAPS: 50000.0000 [IU] | ORAL_CAPSULE | ORAL | 0 refills | Status: DC

## 2020-06-30 MED FILL — VIT D2 1.25 MG (50,000 UNIT: 1.25 MG | 28 days supply | Qty: 4 | Fill #0

## 2020-07-07 NOTE — Progress Notes (Addendum)
Chief Complaint:   OBESITY Steven Gillespie is here to discuss his progress with his obesity treatment plan along with follow-up of his obesity related diagnoses. Steven Gillespie is on the Category 4 Plan and states he is following his eating plan approximately 98% of the time. Steven Gillespie states he is exercising for 0 minutes 0 times per week.  Today's visit was #: 2 Starting weight: 290 lbs Starting date: 06/16/2020 Today's weight: 287 lbs Today's date: 06/30/2020 Total lbs lost to date: 3 lbs Total lbs lost since last in-office visit: 3 lbs Total weight loss percentage to date: -1.03%  Interim History: Steven Gillespie says it is difficult to get in all the proteins at night.  He says it is a lot of meat at lunch as well.  No hunger.  Cravings are controlled.  He is eating everything on plan, except maybe some cheese slices at lunch.  Denies issues or concerns.  Assessment/Plan:    1. Pre-diabetes New.  Discussed labs with patient today.  Steven Gillespie says that he was never told in the past that his A1c was elevated nor does he have a history of elevated FBG.  Plan:  Handouts given on prediabetes and insulin resistance after long discussion had.  Follow prudent nutritional plan, weight loss.  Reck q 3 mo or so  Lab Results  Component Value Date   HGBA1C 5.8 (H) 06/16/2020   Lab Results  Component Value Date   INSULIN 29.0 (H) 06/16/2020     2. Vitamin D deficiency Discussed labs with patient today.  Steven Gillespie has a history of Vitamin D deficiency with resultant generalized fatigue as his primary symptom.  he is taking no vitamin D supplement for this deficiency.  Most recent Vitamin D lab reviewed-  level: 31.0 on 06/16/2020.  Plan:   - Discussed importance of vitamin D (as well as calcium) to their health and well-being.   - We reviewed possible symptoms of low Vitamin D including low energy, depressed mood, muscle aches, joint aches, osteoporosis etc.  - We discussed that low Vitamin D levels may  be linked to an increased risk of cardiovascular events and even increased risk of cancers- such as colon and breast.   - Educated pt that weight loss will likely improve availability of vitamin D, thus encouraged Steven Gillespie to continue with meal plan and their weight loss efforts to further improve this condition  - I recommend pt take weekly 50,000 IU prescription vit D- see script below- which pt agrees to after discussion of risks and benefits of this medication.      - Informed patient this may be a lifelong thing, and he was encouraged to continue to take the medicine until pt told otherwise.   We will need to monitor levels regularly ( q 3-4 mo on average )  to keep levels within normal limits.   - All pt's questions and concerns regarding this condition addressed  -Start Vitamin D, Ergocalciferol, (DRISDOL) 1.25 MG (50000 UNIT) CAPS capsule; Take 1 capsule (50,000 Units total) by mouth every 7 (seven) days.  Dispense: 4 capsule; Refill: 0   3. Other hyperlipidemia Discussed labs with patient today.  Steven Gillespie has hyperlipidemia and has been trying to improve his cholesterol levels with intensive lifestyle modification including a low saturated fat diet, exercise and weight loss. He denies any chest pain, claudication or myalgias.  He is taking Lipitor 40 mg daily with no symptoms or concerns.  Plan:  Repeat CMP and FLP in around 2 months  or since PCP increase dose at the end of September (doubled it).  Continue prudent nutritional plan and weight loss.  Lab Results  Component Value Date   ALT 60 (H) 05/29/2020   AST 33 05/29/2020   ALKPHOS 57 05/29/2020   BILITOT 0.4 05/29/2020   Lab Results  Component Value Date   CHOL 180 05/29/2020   HDL 48.50 05/29/2020   LDLCALC 110 (H) 05/29/2020   LDLDIRECT 111.0 05/29/2020   TRIG 110.0 05/29/2020   CHOLHDL 4 05/29/2020    4. At risk for diabetes mellitus - Steven Gillespie was given extensive diabetes prevention education and counseling today of  more than 28 minutes.  - Counseled patient on pathophysiology of disease and discussed various treatment options which always includes dietary and lifestyle modification as first line.   - Importance of healthy diet with very limited amounts of simple carbohydrates discussed with patient in addition to regular aerobic exercise to an eventual goal of 72min 5d/week or more.  - Handouts provided at patient's desire and or told to go online at the American Diabetes Association website for further information   5. Class 2 severe obesity with serious comorbidity and body mass index (BMI) of 37.0 to 37.9 in adult, unspecified obesity type Steven Gillespie)  Steven Gillespie is currently in the action stage of change. As such, his goal is to continue with weight loss efforts. He has agreed to the Category 4 Plan.   Exercise goals: All adults should avoid inactivity. Some physical activity is better than none, and adults who participate in any amount of physical activity gain some health benefits.  He is getting a Peloton bike next week.  Behavioral modification strategies: increasing lean protein intake, decreasing simple carbohydrates, increasing water intake (8.5 bottles/day), meal planning and cooking strategies and planning for success, recipes and food prep options discussed with him.  Steven Gillespie has agreed to follow-up with our clinic in 2 weeks. He was informed of the importance of frequent follow-up visits to maximize his success with intensive lifestyle modifications for his multiple health conditions.     Objective:   Blood pressure 137/75, pulse 63, temperature 98.1 F (36.7 C), height 6\' 2"  (1.88 m), weight 287 lb (130.2 kg), SpO2 100 %. Body mass index is 36.85 kg/m.  General: Cooperative, alert, well developed, in no acute distress. HEENT: Conjunctivae and lids unremarkable. Cardiovascular: Regular rhythm.  Lungs: Normal work of breathing. Neurologic: No focal deficits.   Lab Results  Component Value  Date   CREATININE 1.32 05/29/2020   BUN 20 05/29/2020   NA 139 05/29/2020   K 3.8 05/29/2020   CL 103 05/29/2020   CO2 30 05/29/2020   Lab Results  Component Value Date   ALT 60 (H) 05/29/2020   AST 33 05/29/2020   ALKPHOS 57 05/29/2020   BILITOT 0.4 05/29/2020   Lab Results  Component Value Date   HGBA1C 5.8 (H) 06/16/2020   Lab Results  Component Value Date   INSULIN 29.0 (H) 06/16/2020   Lab Results  Component Value Date   TSH 2.39 05/29/2020   Lab Results  Component Value Date   CHOL 180 05/29/2020   HDL 48.50 05/29/2020   LDLCALC 110 (H) 05/29/2020   LDLDIRECT 111.0 05/29/2020   TRIG 110.0 05/29/2020   CHOLHDL 4 05/29/2020   Lab Results  Component Value Date   WBC 6.0 05/29/2020   HGB 14.8 05/29/2020   HCT 44.3 05/29/2020   MCV 89.8 05/29/2020   PLT 235.0 05/29/2020   Attestation Statements:  Reviewed by clinician on day of visit: allergies, medications, problem list, medical history, surgical history, family history, social history, and previous encounter notes.  I, Water quality scientist, CMA, am acting as Location manager for [employer], DO.  I have reviewed the above documentation for accuracy and completeness, and I agree with the above. Marjory Sneddon, D.O.  The [place] was signed into law in 2016 which includes the topic of electronic health records.  This provides immediate access to information in MyChart.  This includes consultation notes, operative notes, office notes, lab results and pathology reports.  If you have any questions about what you read please let us know at your next visit so we can discuss your concerns and take corrective action if need be.  We are right here with you.

## 2020-07-14 ENCOUNTER — Ambulatory Visit (INDEPENDENT_AMBULATORY_CARE_PROVIDER_SITE_OTHER): Payer: 59 | Admitting: Family Medicine

## 2020-07-14 ENCOUNTER — Encounter (INDEPENDENT_AMBULATORY_CARE_PROVIDER_SITE_OTHER): Payer: Self-pay | Admitting: Family Medicine

## 2020-07-14 ENCOUNTER — Other Ambulatory Visit: Payer: Self-pay

## 2020-07-14 VITALS — BP 116/78 | HR 76 | Temp 97.9°F | Ht 74.0 in | Wt 286.0 lb

## 2020-07-14 DIAGNOSIS — E559 Vitamin D deficiency, unspecified: Secondary | ICD-10-CM | POA: Insufficient documentation

## 2020-07-14 DIAGNOSIS — Z6836 Body mass index (BMI) 36.0-36.9, adult: Secondary | ICD-10-CM

## 2020-07-15 NOTE — Progress Notes (Signed)
Chief Complaint:   OBESITY Steven Gillespie is here to discuss his progress with his obesity treatment plan along with follow-up of his obesity related diagnoses. Steven Gillespie is on the Category 4 Plan and states he is following his eating plan approximately 80% of the time. Steven Gillespie states he is exercising for 0 minutes 0 times per week.  Today's visit was #: 3 Starting weight: 290 lbs Starting date: 06/16/2020 Today's weight: 286 lbs Today's date: 07/14/2020 Total lbs lost to date: 4 lbs Total lbs lost since last in-office visit: 1 lb Total weight loss percentage to date: -1.38%  Interim History: Steven Gillespie had the [employer] homecoming with parties over this weekend.  Their Peloton is coming this week as well.  He says he ate off plan one day at the homecoming celebration, but the next day, he got back on plan. He is up to 90 ounces per day of water as well.  Plan:  Discussed various recipes and ways to add variety to meals.  Encouraged to use 300 snack calories to enhance meals, such as adding feta, etc.  Assessment/Plan:   1. Vitamin D deficiency Discussed labs with patient today.  Steven Gillespie's Vitamin D level was 31.0 on 06/16/2020. He is currently taking prescription vitamin D 50,000 IU each week. He denies nausea, vomiting or muscle weakness.  Tolerating well.  Plan:  Continue vitamin D.  No refill needed.  Recheck level in 3 months or so.  2. Class 2 severe obesity with serious comorbidity and body mass index (BMI) of 36.0 to 36.9 in adult, unspecified obesity type [facility])  Steven Gillespie is currently in the action stage of change. As such, his goal is to continue with weight loss efforts. He has agreed to the Category 4 Plan.   Exercise goals: For substantial health benefits, adults should do at least 150 minutes (2 hours and 30 minutes) a week of moderate-intensity, or 75 minutes (1 hour and 15 minutes) a week of vigorous-intensity aerobic physical activity, or an equivalent combination of moderate- and  vigorous-intensity aerobic activity. Aerobic activity should be performed in episodes of at least 10 minutes, and preferably, it should be spread throughout the week.  Behavioral modification strategies: meal planning and cooking strategies, better snacking choices and planning for success.  Steven Gillespie has agreed to follow-up with our clinic in 2 weeks. He was informed of the importance of frequent follow-up visits to maximize his success with intensive lifestyle modifications for his multiple health conditions.   Objective:   Blood pressure 116/78, pulse 76, temperature 97.9 F (36.6 C), height 6\' 2"  (1.88 m), weight 286 lb (129.7 kg), SpO2 96 %. Body mass index is 36.72 kg/m.  General: Cooperative, alert, well developed, in no acute distress. HEENT: Conjunctivae and lids unremarkable. Cardiovascular: Regular rhythm.  Lungs: Normal work of breathing. Neurologic: No focal deficits.   Lab Results  Component Value Date   CREATININE 1.32 05/29/2020   BUN 20 05/29/2020   NA 139 05/29/2020   K 3.8 05/29/2020   CL 103 05/29/2020   CO2 30 05/29/2020   Lab Results  Component Value Date   ALT 60 (H) 05/29/2020   AST 33 05/29/2020   ALKPHOS 57 05/29/2020   BILITOT 0.4 05/29/2020   Lab Results  Component Value Date   HGBA1C 5.8 (H) 06/16/2020   Lab Results  Component Value Date   INSULIN 29.0 (H) 06/16/2020   Lab Results  Component Value Date   TSH 2.39 05/29/2020   Lab Results  Component Value  Date   CHOL 180 05/29/2020   HDL 48.50 05/29/2020   LDLCALC 110 (H) 05/29/2020   LDLDIRECT 111.0 05/29/2020   TRIG 110.0 05/29/2020   CHOLHDL 4 05/29/2020   Lab Results  Component Value Date   WBC 6.0 05/29/2020   HGB 14.8 05/29/2020   HCT 44.3 05/29/2020   MCV 89.8 05/29/2020   PLT 235.0 05/29/2020   Attestation Statements:   Reviewed by clinician on day of visit: allergies, medications, problem list, medical history, surgical history, family history, social history, and  previous encounter notes.  Time spent on visit including pre-visit chart review and post-visit care and charting was 22 minutes.   I, Water quality scientist, CMA, am acting as Location manager for [employer], DO.  I have reviewed the above documentation for accuracy and completeness, and I agree with the above. Marjory Sneddon, D.O.  The Reform was signed into law in 2016 which includes the topic of electronic health records.  This provides immediate access to information in MyChart.  This includes consultation notes, operative notes, office notes, lab results and pathology reports.  If you have any questions about what you read please let us know at your next visit so we can discuss your concerns and take corrective action if need be.  We are right here with you.

## 2020-07-17 ENCOUNTER — Encounter: Payer: Self-pay | Admitting: Gastroenterology

## 2020-07-17 ENCOUNTER — Encounter: Payer: Self-pay | Admitting: Family Medicine

## 2020-07-17 ENCOUNTER — Other Ambulatory Visit: Payer: Self-pay | Admitting: Gastroenterology

## 2020-07-17 ENCOUNTER — Ambulatory Visit (AMBULATORY_SURGERY_CENTER): Payer: Self-pay | Admitting: *Deleted

## 2020-07-17 ENCOUNTER — Other Ambulatory Visit: Payer: Self-pay

## 2020-07-17 ENCOUNTER — Encounter: Payer: Self-pay | Admitting: *Deleted

## 2020-07-17 VITALS — Ht 74.0 in | Wt 287.0 lb

## 2020-07-17 DIAGNOSIS — Z1211 Encounter for screening for malignant neoplasm of colon: Secondary | ICD-10-CM

## 2020-07-17 MED ORDER — NA SULFATE-K SULFATE-MG SULF 17.5-3.13-1.6 GM/177ML PO SOLN: ORAL | 0 refills | Status: DC

## 2020-07-17 MED FILL — SUPREP BOWEL PREP KIT: 17.5-3.13-1 | 1 days supply | Qty: 354 | Fill #0

## 2020-07-17 NOTE — Progress Notes (Signed)
Patient is here in-person for PV. Patient denies any allergies to eggs or soy. Patient denies any problems with anesthesia/sedation. Patient denies any oxygen use at home. Patient denies taking any diet/weight loss medications or blood thinners. Patient is not being treated for MRSA or C-diff. Patient is aware of our care-partner policy and [id number]-[id number] safety protocol. EMMI education assigned to the patient for the procedure, sent to [facility].   COVID-19 vaccines completed on 07/16/2020 booster, per patient.

## 2020-07-29 ENCOUNTER — Other Ambulatory Visit: Payer: Self-pay

## 2020-07-29 ENCOUNTER — Ambulatory Visit (INDEPENDENT_AMBULATORY_CARE_PROVIDER_SITE_OTHER): Payer: 59 | Admitting: Family Medicine

## 2020-07-29 ENCOUNTER — Encounter (INDEPENDENT_AMBULATORY_CARE_PROVIDER_SITE_OTHER): Payer: Self-pay | Admitting: Family Medicine

## 2020-07-29 VITALS — BP 115/75 | HR 74 | Temp 98.1°F | Ht 74.0 in | Wt 281.0 lb

## 2020-07-29 DIAGNOSIS — Z9189 Other specified personal risk factors, not elsewhere classified: Secondary | ICD-10-CM | POA: Diagnosis not present

## 2020-07-29 DIAGNOSIS — Z6836 Body mass index (BMI) 36.0-36.9, adult: Secondary | ICD-10-CM

## 2020-07-29 DIAGNOSIS — E559 Vitamin D deficiency, unspecified: Secondary | ICD-10-CM | POA: Diagnosis not present

## 2020-07-31 ENCOUNTER — Ambulatory Visit (AMBULATORY_SURGERY_CENTER): Payer: 59 | Admitting: Gastroenterology

## 2020-07-31 ENCOUNTER — Other Ambulatory Visit: Payer: Self-pay

## 2020-07-31 ENCOUNTER — Encounter: Payer: Self-pay | Admitting: Gastroenterology

## 2020-07-31 VITALS — BP 104/71 | HR 52 | Temp 98.2°F | Resp 12 | Ht 74.0 in | Wt 287.0 lb

## 2020-07-31 DIAGNOSIS — D125 Benign neoplasm of sigmoid colon: Secondary | ICD-10-CM | POA: Diagnosis not present

## 2020-07-31 DIAGNOSIS — Z1211 Encounter for screening for malignant neoplasm of colon: Secondary | ICD-10-CM | POA: Diagnosis not present

## 2020-07-31 DIAGNOSIS — K5730 Diverticulosis of large intestine without perforation or abscess without bleeding: Secondary | ICD-10-CM

## 2020-07-31 MED ORDER — SODIUM CHLORIDE 0.9 % IV SOLN: 500.0000 mL | Freq: Once | INTRAVENOUS | Status: DC

## 2020-07-31 NOTE — Progress Notes (Signed)
Called to room to assist during endoscopic procedure.  Patient ID and intended procedure confirmed with present staff. Received instructions for my participation in the procedure from the performing physician.  

## 2020-07-31 NOTE — Patient Instructions (Signed)
Handout on polyps and diverticulosis given to you today  Await pathology results on polyp removed today    YOU HAD AN ENDOSCOPIC PROCEDURE TODAY AT [facility]:   Refer to the procedure report that was given to you for any specific questions about what was found during the examination.  If the procedure report does not answer your questions, please call your gastroenterologist to clarify.  If you requested that your care partner not be given the details of your procedure findings, then the procedure report has been included in a sealed envelope for you to review at your convenience later.  YOU SHOULD EXPECT: Some feelings of bloating in the abdomen. Passage of more gas than usual.  Walking can help get rid of the air that was put into your GI tract during the procedure and reduce the bloating. If you had a lower endoscopy (such as a colonoscopy or flexible sigmoidoscopy) you may notice spotting of blood in your stool or on the toilet paper. If you underwent a bowel prep for your procedure, you may not have a normal bowel movement for a few days.  Please Note:  You might notice some irritation and congestion in your nose or some drainage.  This is from the oxygen used during your procedure.  There is no need for concern and it should clear up in a day or so.  SYMPTOMS TO REPORT IMMEDIATELY:   Following lower endoscopy (colonoscopy or flexible sigmoidoscopy):  Excessive amounts of blood in the stool  Significant tenderness or worsening of abdominal pains  Swelling of the abdomen that is new, acute  Fever of 100F or higher    For urgent or emergent issues, a gastroenterologist can be reached at any hour by calling [phone]. Do not use MyChart messaging for urgent concerns.    DIET:  We do recommend a small meal at first, but then you may proceed to your regular diet.  Drink plenty of fluids but you should avoid alcoholic beverages for 24 hours.  ACTIVITY:  You  should plan to take it easy for the rest of today and you should NOT DRIVE or use heavy machinery until tomorrow (because of the sedation medicines used during the test).    FOLLOW UP: Our staff will call the number listed on your records 48-72 hours following your procedure to check on you and address any questions or concerns that you may have regarding the information given to you following your procedure. If we do not reach you, we will leave a message.  We will attempt to reach you two times.  During this call, we will ask if you have developed any symptoms of COVID 19. If you develop any symptoms (ie: fever, flu-like symptoms, shortness of breath, cough etc.) before then, please call [phone].  If you test positive for Covid 19 in the 2 weeks post procedure, please call and report this information to [place].    If any biopsies were taken you will be contacted by phone or by letter within the next 1-3 weeks.  Please call us at [phone] if you have not heard about the biopsies in 3 weeks.    SIGNATURES/CONFIDENTIALITY: You and/or your care partner have signed paperwork which will be entered into your electronic medical record.  These signatures attest to the fact that that the information above on your After Visit Summary has been reviewed and is understood.  Full responsibility of the confidentiality of this discharge information lies with  you and/or your care-partner.

## 2020-07-31 NOTE — Progress Notes (Signed)
VS by CW  Pt's states no medical or surgical changes since previsit or office visit.  

## 2020-07-31 NOTE — Progress Notes (Signed)
A and O x3. Report to RN. Tolerated MAC anesthesia well.

## 2020-07-31 NOTE — Op Note (Signed)
[facility] Patient Name: Steven Gillespie Procedure Date: 07/31/2020 9:15 AM MRN: [id number] Endoscopist: Gerrit Heck , MD Age: 49 Referring MD:  Date of Birth: 12/15/1970 Gender: Male Account #: [id number] Procedure:                Colonoscopy Indications:              Screening for colorectal malignant neoplasm, This                            is the patient's first colonoscopy Medicines:                Monitored Anesthesia Care Procedure:                Pre-Anesthesia Assessment:                           - Prior to the procedure, a History and Physical                            was performed, and patient medications and                            allergies were reviewed. The patient's tolerance of                            previous anesthesia was also reviewed. The risks                            and benefits of the procedure and the sedation                            options and risks were discussed with the patient.                            All questions were answered, and informed consent                            was obtained. Prior Anticoagulants: The patient has                            taken no previous anticoagulant or antiplatelet                            agents. ASA Grade Assessment: II - A patient with                            mild systemic disease. After reviewing the risks                            and benefits, the patient was deemed in                            satisfactory condition to undergo the procedure.  After obtaining informed consent, the colonoscope                            was passed under direct vision. Throughout the                            procedure, the patient's blood pressure, pulse, and                            oxygen saturations were monitored continuously. The                            Colonoscope was introduced through the anus and                            advanced to the the  terminal ileum. The colonoscopy                            was performed without difficulty. The patient                            tolerated the procedure well. The quality of the                            bowel preparation was good. The terminal ileum,                            ileocecal valve, appendiceal orifice, and rectum                            were photographed. Scope In: 9:31:48 AM Scope Out: 9:46:01 AM Scope Withdrawal Time: 0 hours 11 minutes 5 seconds  Total Procedure Duration: 0 hours 14 minutes 13 seconds  Findings:                 The perianal and digital rectal examinations were                            normal.                           A 5 mm polyp was found in the sigmoid colon. The                            polyp was sessile. The polyp was removed with a                            cold snare. Resection and retrieval were complete.                            Estimated blood loss was minimal.                           A single small-mouthed diverticulum was found in  the ascending colon.                           Retroflexion in the right colon was performed.                           The retroflexed view of the distal rectum and anal                            verge was normal and showed no anal or rectal                            abnormalities.                           The terminal ileum appeared normal. Complications:            No immediate complications. Estimated Blood Loss:     Estimated blood loss was minimal. Impression:               - One 5 mm polyp in the sigmoid colon, removed with                            a cold snare. Resected and retrieved.                           - Diverticulosis in the ascending colon.                           - The distal rectum and anal verge are normal on                            retroflexion view.                           - The examined portion of the ileum was normal. Recommendation:            - Patient has a contact number available for                            emergencies. The signs and symptoms of potential                            delayed complications were discussed with the                            patient. Return to normal activities tomorrow.                            Written discharge instructions were provided to the                            patient.                           - Resume previous diet.                           -  Continue present medications.                           - Await pathology results.                           - Repeat colonoscopy in 5-10 years for surveillance                            based on pathology results.                           - Return to GI office PRN. Gerrit Heck, MD 07/31/2020 9:50:43 AM

## 2020-08-04 ENCOUNTER — Telehealth: Payer: Self-pay

## 2020-08-04 ENCOUNTER — Other Ambulatory Visit (INDEPENDENT_AMBULATORY_CARE_PROVIDER_SITE_OTHER): Payer: Self-pay | Admitting: Family Medicine

## 2020-08-04 DIAGNOSIS — E559 Vitamin D deficiency, unspecified: Secondary | ICD-10-CM

## 2020-08-04 MED ORDER — VITAMIN D (ERGOCALCIFEROL) 1.25 MG (50000 UNIT) PO CAPS: 50000.0000 [IU] | ORAL_CAPSULE | ORAL | 0 refills | Status: DC

## 2020-08-04 MED FILL — VIT D2 1.25 MG (50,000 UNIT: 1.25 MG | 28 days supply | Qty: 4 | Fill #0

## 2020-08-04 NOTE — Telephone Encounter (Signed)
Last vitamin d level 31.0 Can you approve or deny please Next visit not until 08/13/20

## 2020-08-04 NOTE — Telephone Encounter (Signed)
  Follow up Call-  Call back number 07/31/2020  Post procedure Call Back phone  # [phone]  Permission to leave phone message Yes  Some recent data might be hidden     Patient questions:  Do you have a fever, pain , or abdominal swelling? No. Pain Score  0 *  Have you tolerated food without any problems? Yes.    Have you been able to return to your normal activities? Yes.    Do you have any questions about your discharge instructions: Diet   No. Medications  No. Follow up visit  No.  Do you have questions or concerns about your Care? No.  Actions: * If pain score is 4 or above: No action needed, pain <4. 1. Have you developed a fever since your procedure? no  2.   Have you had an respiratory symptoms (SOB or cough) since your procedure? no  3.   Have you tested positive for COVID 19 since your procedure no  4.   Have you had any family members/close contacts diagnosed with the COVID 19 since your procedure?  no   If yes to any of these questions please route to Joylene John, RN and Joella Prince, RN

## 2020-08-04 NOTE — Telephone Encounter (Signed)
I sent in his vit D RF request via the OV note.

## 2020-08-04 NOTE — Progress Notes (Signed)
Chief Complaint:   OBESITY Steven Gillespie is here to discuss his progress with his obesity treatment plan along with follow-up of his obesity related diagnoses. Steven Gillespie is on the Category 4 Plan and states he is following his eating plan approximately 99% of the time. Steven Gillespie states he is exercising using the Peloton bike 10 minutes 1 time per week.  Today's visit was #: 4 Starting weight: 290 lbs Starting date: 06/16/2020 Today's weight: 281 lbs Today's date: 07/29/2020 Total lbs lost to date: 9 lbs Total lbs lost since last in-office visit: 5 lbs Total weight loss percentage to date: -3.10%  Interim History: Steven Gillespie states it's going well. "It's a lot of food." He can't imagine eating more. Steven Gillespie plans to go to his parents house for Thanksgiving.   Assessment/Plan:   1. Vitamin D deficiency Steven Gillespie's Vitamin D level was 31.0 on 06/16/2020. He is currently taking prescription vitamin D 50,000 IU each week. He denies nausea, vomiting or muscle weakness and is tolerating the medication well.  Plan: Steven Gillespie will continue medication as prescribed and denies need for refill.  2. Class 2 severe obesity with serious comorbidity and body mass index (BMI) of 36.0 to 36.9 in adult, unspecified obesity type [facility] Steven Gillespie is currently in the action stage of change. As such, his goal is to continue with weight loss efforts. He has agreed to the Category 4 Plan with breakfast options.   Exercise goals: As is  Behavioral modification strategies: increasing lean protein intake, meal planning and cooking strategies, holiday eating strategies  and celebration eating strategies.  Steven Gillespie has agreed to follow-up with our clinic in 2 weeks. He was informed of the importance of frequent follow-up visits to maximize his success with intensive lifestyle modifications for his multiple health conditions.   Objective:   Blood pressure 115/75, pulse 74, temperature 98.1 F (36.7 C), height 6\' 2"  (1.88 m),  weight 281 lb (127.5 kg), SpO2 97 %. Body mass index is 36.08 kg/m.  General: Cooperative, alert, well developed, in no acute distress. HEENT: Conjunctivae and lids unremarkable. Cardiovascular: Regular rhythm.  Lungs: Normal work of breathing. Neurologic: No focal deficits.   Lab Results  Component Value Date   CREATININE 1.32 05/29/2020   BUN 20 05/29/2020   NA 139 05/29/2020   K 3.8 05/29/2020   CL 103 05/29/2020   CO2 30 05/29/2020   Lab Results  Component Value Date   ALT 60 (H) 05/29/2020   AST 33 05/29/2020   ALKPHOS 57 05/29/2020   BILITOT 0.4 05/29/2020   Lab Results  Component Value Date   HGBA1C 5.8 (H) 06/16/2020   Lab Results  Component Value Date   INSULIN 29.0 (H) 06/16/2020   Lab Results  Component Value Date   TSH 2.39 05/29/2020   Lab Results  Component Value Date   CHOL 180 05/29/2020   HDL 48.50 05/29/2020   LDLCALC 110 (H) 05/29/2020   LDLDIRECT 111.0 05/29/2020   TRIG 110.0 05/29/2020   CHOLHDL 4 05/29/2020   Lab Results  Component Value Date   WBC 6.0 05/29/2020   HGB 14.8 05/29/2020   HCT 44.3 05/29/2020   MCV 89.8 05/29/2020   PLT 235.0 05/29/2020    Attestation Statements:   Reviewed by clinician on day of visit: allergies, medications, problem list, medical history, surgical history, family history, social history, and previous encounter notes.  Time spent on visit including pre-visit chart review and post-visit care and charting was 16 minutes.   IKathlene November,  am acting as Location manager for [employer], DO.   I have reviewed the above documentation for accuracy and completeness, and I agree with the above. Marjory Sneddon, D.O.  The [place] was signed into law in 2016 which includes the topic of electronic health records.  This provides immediate access to information in MyChart.  This includes consultation notes, operative notes, office notes, lab results and pathology reports.  If you  have any questions about what you read please let us know at your next visit so we can discuss your concerns and take corrective action if need be.  We are right here with you.

## 2020-08-04 NOTE — Addendum Note (Signed)
Addended by: Neomia Dear on: 08/04/2020 03:02 PM   Modules accepted: Orders

## 2020-08-11 ENCOUNTER — Encounter: Payer: Self-pay | Admitting: Gastroenterology

## 2020-08-13 ENCOUNTER — Ambulatory Visit (INDEPENDENT_AMBULATORY_CARE_PROVIDER_SITE_OTHER): Payer: 59 | Admitting: Family Medicine

## 2020-08-13 ENCOUNTER — Other Ambulatory Visit: Payer: Self-pay

## 2020-08-13 ENCOUNTER — Encounter (INDEPENDENT_AMBULATORY_CARE_PROVIDER_SITE_OTHER): Payer: Self-pay | Admitting: Family Medicine

## 2020-08-13 VITALS — BP 126/85 | HR 87 | Temp 97.9°F | Ht 74.0 in | Wt 282.0 lb

## 2020-08-13 DIAGNOSIS — Z9189 Other specified personal risk factors, not elsewhere classified: Secondary | ICD-10-CM | POA: Diagnosis not present

## 2020-08-13 DIAGNOSIS — E559 Vitamin D deficiency, unspecified: Secondary | ICD-10-CM | POA: Diagnosis not present

## 2020-08-13 DIAGNOSIS — Z6836 Body mass index (BMI) 36.0-36.9, adult: Secondary | ICD-10-CM | POA: Diagnosis not present

## 2020-08-17 NOTE — Progress Notes (Signed)
Chief Complaint:   OBESITY Steven Gillespie is here to discuss his progress with his obesity treatment plan along with follow-up of his obesity related diagnoses. Steven Gillespie is on the Category 4 Plan with breakfast options and states he is following his eating plan approximately 70% of the time. Bayani states he is exercising 0 minutes 0 times per week.  Today's visit was #: 5 Starting weight: 290 lbs Starting date: 06/16/2020 Today's weight: 282 lbs Today's date: 08/13/2020 Total lbs lost to date: 8 lbs Total lbs lost since last in-office visit: +1 lb Total weight loss percentage to date: -2.76%  Interim History: Steven Gillespie reports that he is finishing his school work for the semester on December 10th. He will have more time to care for himself as far as cooking and exercising. He traveled over Thanksgiving and didn't eat on the plan. He is surprised and happy that he only gained 1 lb. Steven Gillespie has no issues or concerns with the plan otherwise.  Assessment/Plan:     1. Vitamin D deficiency Steven Gillespie's Vitamin D level was 31.0 on 06/16/2020. He is currently taking prescription vitamin D 50,000 IU each week. He denies nausea, vomiting or muscle weakness.  Plan: Continue current treatment plan. Denies need for refill.  - Reiterated importance of vitamin D (as well as calcium) to their health and wellbeing.  - Reminded Steven Gillespie that weight loss will likely improve availability of vitamin D, thus encouraged him to continue with meal plan and their weight loss efforts to further improve this condition. - I recommend patient continue to take weekly prescription vit D 50,000 IU - Informed patient this may be a lifelong thing, and he was encouraged to continue to take the medicine until told otherwise.   - we will need to monitor levels regularly (every 3-4 mo on average) to keep levels within normal limits.  - weight loss will likely improve availability of vitamin D, thus encouraged Steven Gillespie to  continue with meal plan and their weight loss efforts to further improve this condition - pt's questions and concerns regarding this condition addressed.  2. At risk for dehydration Steven Gillespie is at higher than average risk of dehydration. Steven Gillespie was given more than 10 minutes of proper hydration counseling today. We discussed the signs and symptoms of dehydration some of which may include muscle cramping, constipation or even orthostatic symptoms. Counseling on the prevention of dehydration was also provided today. Steven Gillespie is at risk for dehydration due to weight loss, lifestyle and behavorial habits and possibly due to taking certain medication(s).  He was encouraged to adequately hydrate and monitor fluid status to avoid dehydration as well as weight loss plateaus. Unless pre-existing renal or cardiopulmonary conditions exist which pt was told to limit their fluid intake, I recommended roughly one half of their weight in pounds to be the approximate ounces of non-caloric, non-caffeinated beverages they should drink per day; including more if they are engaging in exercise.  3. Class 2 severe obesity with serious comorbidity and body mass index (BMI) of 36.0 to 36.9 in adult, unspecified obesity type [facility]) Steven Gillespie is currently in the action stage of change. As such, his goal is to continue with weight loss efforts. He has agreed to the Category 4 Plan with breakfast options.   Exercise goals: 5 days a week for 30 minutes  Behavioral modification strategies: increasing water intake, meal planning and cooking strategies, meal prep, and planning for success.  Steven Gillespie has agreed to follow-up with our clinic in  2-3 weeks. He was informed of the importance of frequent follow-up visits to maximize his success with intensive lifestyle modifications for his multiple health conditions.   Objective:   Blood pressure 126/85, pulse 87, temperature 97.9 F (36.6 C), height 6\' 2"  (1.88 m), weight 282 lb (127.9  kg), SpO2 97 %. Body mass index is 36.21 kg/m.  General: Cooperative, alert, well developed, in no acute distress. HEENT: Conjunctivae and lids unremarkable. Cardiovascular: Regular rhythm.  Lungs: Normal work of breathing. Neurologic: No focal deficits.   Lab Results  Component Value Date   CREATININE 1.32 05/29/2020   BUN 20 05/29/2020   NA 139 05/29/2020   K 3.8 05/29/2020   CL 103 05/29/2020   CO2 30 05/29/2020   Lab Results  Component Value Date   ALT 60 (H) 05/29/2020   AST 33 05/29/2020   ALKPHOS 57 05/29/2020   BILITOT 0.4 05/29/2020   Lab Results  Component Value Date   HGBA1C 5.8 (H) 06/16/2020   Lab Results  Component Value Date   INSULIN 29.0 (H) 06/16/2020   Lab Results  Component Value Date   TSH 2.39 05/29/2020   Lab Results  Component Value Date   CHOL 180 05/29/2020   HDL 48.50 05/29/2020   LDLCALC 110 (H) 05/29/2020   LDLDIRECT 111.0 05/29/2020   TRIG 110.0 05/29/2020   CHOLHDL 4 05/29/2020   Lab Results  Component Value Date   WBC 6.0 05/29/2020   HGB 14.8 05/29/2020   HCT 44.3 05/29/2020   MCV 89.8 05/29/2020   PLT 235.0 05/29/2020    Attestation Statements:   Reviewed by clinician on day of visit: allergies, medications, problem list, medical history, surgical history, family history, social history, and previous encounter notes.  Coral Ceo, am acting as Location manager for [employer], DO.  I have reviewed the above documentation for accuracy and completeness, and I agree with the above. -  *Marjory Sneddon, D.O.  The [place] was signed into law in 2016 which includes the topic of electronic health records.  This provides immediate access to information in MyChart.  This includes consultation notes, operative notes, office notes, lab results and pathology reports.  If you have any questions about what you read please let us know at your next visit so we can discuss your concerns and take corrective  action if need be.  We are right here with you.

## 2020-09-01 ENCOUNTER — Other Ambulatory Visit: Payer: Self-pay

## 2020-09-01 ENCOUNTER — Encounter (INDEPENDENT_AMBULATORY_CARE_PROVIDER_SITE_OTHER): Payer: Self-pay | Admitting: Family Medicine

## 2020-09-01 ENCOUNTER — Ambulatory Visit (INDEPENDENT_AMBULATORY_CARE_PROVIDER_SITE_OTHER): Payer: 59 | Admitting: Family Medicine

## 2020-09-01 ENCOUNTER — Other Ambulatory Visit (INDEPENDENT_AMBULATORY_CARE_PROVIDER_SITE_OTHER): Payer: Self-pay | Admitting: Family Medicine

## 2020-09-01 VITALS — BP 118/74 | HR 66 | Temp 97.8°F | Ht 74.0 in | Wt 278.0 lb

## 2020-09-01 DIAGNOSIS — Z6835 Body mass index (BMI) 35.0-35.9, adult: Secondary | ICD-10-CM | POA: Diagnosis not present

## 2020-09-01 DIAGNOSIS — E559 Vitamin D deficiency, unspecified: Secondary | ICD-10-CM | POA: Diagnosis not present

## 2020-09-01 DIAGNOSIS — E7849 Other hyperlipidemia: Secondary | ICD-10-CM

## 2020-09-01 DIAGNOSIS — R7303 Prediabetes: Secondary | ICD-10-CM | POA: Diagnosis not present

## 2020-09-01 DIAGNOSIS — Z9189 Other specified personal risk factors, not elsewhere classified: Secondary | ICD-10-CM

## 2020-09-01 MED ORDER — VITAMIN D (ERGOCALCIFEROL) 1.25 MG (50000 UNIT) PO CAPS: 50000.0000 [IU] | ORAL_CAPSULE | ORAL | 0 refills | Status: DC

## 2020-09-01 MED FILL — VIT D2 1.25 MG (50,000 UNIT: 1.25 MG | 28 days supply | Qty: 4 | Fill #0

## 2020-09-02 NOTE — Progress Notes (Signed)
Chief Complaint:   OBESITY Steven Gillespie is here to discuss his progress with his obesity treatment plan along with follow-up of his obesity related diagnoses. Steven Gillespie is on the Category 4 Plan with breakfast options and states he is following his eating plan approximately 90% of the time. Steven Gillespie states he is exercising 0 minutes 0 times per week.  Today's visit was #: 6 Starting weight: 290 lbs Starting date: 06/16/2020 Today's weight: 278 lbs Today's date: 09/01/2020 Total lbs lost to date: 12 lbs Total lbs lost since last in-office visit: 4 lbs Total weight loss percentage to date: -4.14%  Interim History: Steven Gillespie reports no issues with the plan. He does state it is difficult to get all dinner proteins and all water in during the day. He denies issues with hunger or cravings. Steven Gillespie has not been exercising due to the busy holiday season.  Plan: Steven Gillespie will move protein ounces for dinner to his lunchtime protein or swap dinner and lunch. We also discussed strategies to increase water intake.   Assessment/Plan:   1. Other hyperlipidemia Steven Gillespie is prescribed Lipitor. He has hyperlipidemia and has been trying to improve his cholesterol levels with intensive lifestyle modification including a low saturated fat diet, exercise and weight loss. He denies any chest pain, claudication or myalgias.  Lab Results  Component Value Date   ALT 60 (H) 05/29/2020   AST 33 05/29/2020   ALKPHOS 57 05/29/2020   BILITOT 0.4 05/29/2020   Lab Results  Component Value Date   CHOL 180 05/29/2020   HDL 48.50 05/29/2020   LDLCALC 110 (H) 05/29/2020   LDLDIRECT 111.0 05/29/2020   TRIG 110.0 05/29/2020   CHOLHDL 4 05/29/2020   Plan: Recheck labs at next OV or 2 days prior. Cardiovascular risk and specific lipid/LDL goals reviewed.  We discussed several lifestyle modifications today and Steven Gillespie will continue to work on diet, exercise and weight loss efforts. Orders and follow up as documented in  patient record.   Counseling Intensive lifestyle modifications are the first line treatment for this issue. . Dietary changes: Increase soluble fiber. Decrease simple carbohydrates. . Exercise changes: Moderate to vigorous-intensity aerobic activity 150 minutes per week if tolerated. . Lipid-lowering medications: see documented in medical record.  Obtain next OV or 2 days prior  - Comprehensive metabolic panel - Lipid panel  2. Pre-diabetes Steven Gillespie has a diagnosis of prediabetes based on his elevated HgA1c and was informed this puts him at greater risk of developing diabetes. He continues to work on diet and exercise to decrease his risk of diabetes. He denies nausea or hypoglycemia.  Lab Results  Component Value Date   HGBA1C 5.8 (H) 06/16/2020   Lab Results  Component Value Date   INSULIN 29.0 (H) 06/16/2020   Plan: Recheck labs at next OV or 2 days prior. Steven Gillespie will continue to work on weight loss, exercise, and decreasing simple carbohydrates to help decrease the risk of diabetes.   Obtain next OV or 2 days prior  - Comprehensive metabolic panel - Hemoglobin A1c - Insulin, random  3. Vitamin D deficiency Steven Gillespie's Vitamin D level was 31.0 on 06/16/2020. He is currently taking prescription vitamin D 50,000 IU each week. He denies nausea, vomiting or muscle weakness.   Ref. Range 06/16/2020 11:24  Vitamin D, 25-Hydroxy Latest Ref Range: 30.0 - 100.0 ng/mL 31.0   Plan: Refill Vit D for 1 month, as per below. Recheck labs at next OV or 2 days prior. Low Vitamin D level contributes to  fatigue and are associated with obesity, breast, and colon cancer. He agrees to continue to take prescription Vitamin D @50 ,000 IU every week and will follow-up for routine testing of Vitamin D, at least 2-3 times per year to avoid over-replacement.  Refill- Vitamin D, Ergocalciferol, (DRISDOL) 1.25 MG (50000 UNIT) CAPS capsule; Take 1 capsule (50,000 Units total) by mouth every 7 (seven) days.   Dispense: 4 capsule; Refill: 0  Obtain next OV or 2 days prior  - VITAMIN D 25 Hydroxy (Vit-D Deficiency, Fractures)  4. At risk for dehydration Steven Gillespie is at higher than average risk of dehydration. Steven Gillespie was given more than 15 minutes of proper hydration counseling today. We discussed the signs and symptoms of dehydration some of which may include muscle cramping, constipation or even orthostatic symptoms. Counseling on the prevention of dehydration was also provided today. Steven Gillespie is at risk for dehydration due to weight loss, lifestyle and behavorial habits and possibly due to taking certain medication(s). He was encouraged to adequately hydrate and monitor fluid status to avoid dehydration as well as weight loss plateaus.  Unless pre-existing renal or cardiopulmonary conditions exist which pt was told to limit their fluid intake, I recommended roughly one half of their weight in pounds to be the approximate ounces of non-caloric, non-caffeinated beverages they should drink per day; including more if they are engaging in exercise.  5. Class 2 severe obesity with serious comorbidity and body mass index (BMI) of 35.0 to 35.9 in adult, unspecified obesity type Steven Gillespie) Steven Gillespie is currently in the action stage of change. As such, his goal is to continue with weight loss efforts. He has agreed to the Category 4 Plan with breakfast options.   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 For substantial health benefits, adults should do at least 150 minutes (2 hours and 30 minutes) a week of moderate-intensity, or 75 minutes (1 hour and 15 minutes) a week of vigorous-intensity aerobic physical activity, or an equivalent combination of moderate- and vigorous-intensity aerobic activity. Aerobic activity should be performed in episodes of at least 10 minutes, and preferably, it should be spread throughout the week.  Behavioral modification strategies: increasing  water intake, holiday eating strategies  and celebration eating strategies.  Steven Gillespie has agreed to follow-up with our clinic in 2 weeks and come in fasting if possible or 2 days prior for fasting bloodwork. He was informed of the importance of frequent follow-up visits to maximize his success with intensive lifestyle modifications for his multiple health conditions.   Steven Gillespie was informed we would discuss his lab results at his next visit, if labs are done 2 days prior to OV, unless there is a critical issue that needs to be addressed sooner. Wake agreed to keep his next visit at the agreed upon time to discuss these results.  Objective:   Blood pressure 118/74, pulse 66, temperature 97.8 F (36.6 C), height 6\' 2"  (1.88 m), weight 278 lb (126.1 kg), SpO2 97 %. Body mass index is 35.69 kg/m.  General: Cooperative, alert, well developed, in no acute distress. HEENT: Conjunctivae and lids unremarkable. Cardiovascular: Regular rhythm.  Lungs: Normal work of breathing. Neurologic: No focal deficits.   Lab Results  Component Value Date   CREATININE 1.32 05/29/2020   BUN 20 05/29/2020   NA 139 05/29/2020   K 3.8 05/29/2020   CL 103 05/29/2020   CO2 30 05/29/2020   Lab Results  Component Value Date  ALT 60 (H) 05/29/2020   AST 33 05/29/2020   ALKPHOS 57 05/29/2020   BILITOT 0.4 05/29/2020   Lab Results  Component Value Date   HGBA1C 5.8 (H) 06/16/2020   Lab Results  Component Value Date   INSULIN 29.0 (H) 06/16/2020   Lab Results  Component Value Date   TSH 2.39 05/29/2020   Lab Results  Component Value Date   CHOL 180 05/29/2020   HDL 48.50 05/29/2020   LDLCALC 110 (H) 05/29/2020   LDLDIRECT 111.0 05/29/2020   TRIG 110.0 05/29/2020   CHOLHDL 4 05/29/2020   Lab Results  Component Value Date   WBC 6.0 05/29/2020   HGB 14.8 05/29/2020   HCT 44.3 05/29/2020   MCV 89.8 05/29/2020   PLT 235.0 05/29/2020    Attestation Statements:   Reviewed by clinician on  day of visit: allergies, medications, problem list, medical history, surgical history, family history, social history, and previous encounter notes.  Coral Ceo, am acting as Location manager for [employer], DO.  I have reviewed the above documentation for accuracy and completeness, and I agree with the above. Marjory Sneddon, D.O.  The [place] was signed into law in 2016 which includes the topic of electronic health records.  This provides immediate access to information in MyChart.  This includes consultation notes, operative notes, office notes, lab results and pathology reports.  If you have any questions about what you read please let us know at your next visit so we can discuss your concerns and take corrective action if need be.  We are right here with you.

## 2020-09-14 ENCOUNTER — Other Ambulatory Visit: Payer: Self-pay

## 2020-09-14 ENCOUNTER — Other Ambulatory Visit: Payer: Self-pay | Admitting: Family

## 2020-09-14 ENCOUNTER — Encounter: Payer: Self-pay | Admitting: Family Medicine

## 2020-09-14 DIAGNOSIS — E7800 Pure hypercholesterolemia, unspecified: Secondary | ICD-10-CM

## 2020-09-14 MED ORDER — ATORVASTATIN CALCIUM 40 MG PO TABS: 40.0000 mg | ORAL_TABLET | Freq: Every day | ORAL | 3 refills | Status: DC

## 2020-09-14 MED FILL — ATORVASTATIN 40 MG TABLET: 40 | 90 days supply | Qty: 90 | Fill #0

## 2020-09-21 ENCOUNTER — Other Ambulatory Visit (INDEPENDENT_AMBULATORY_CARE_PROVIDER_SITE_OTHER): Payer: Self-pay | Admitting: Family Medicine

## 2020-09-21 ENCOUNTER — Encounter (INDEPENDENT_AMBULATORY_CARE_PROVIDER_SITE_OTHER): Payer: Self-pay | Admitting: Family Medicine

## 2020-09-21 ENCOUNTER — Other Ambulatory Visit: Payer: Self-pay

## 2020-09-21 ENCOUNTER — Ambulatory Visit (INDEPENDENT_AMBULATORY_CARE_PROVIDER_SITE_OTHER): Payer: 59 | Admitting: Family Medicine

## 2020-09-21 VITALS — BP 115/78 | HR 77 | Temp 97.8°F | Ht 74.0 in | Wt 280.0 lb

## 2020-09-21 DIAGNOSIS — Z9189 Other specified personal risk factors, not elsewhere classified: Secondary | ICD-10-CM | POA: Insufficient documentation

## 2020-09-21 DIAGNOSIS — E7849 Other hyperlipidemia: Secondary | ICD-10-CM | POA: Diagnosis not present

## 2020-09-21 DIAGNOSIS — E559 Vitamin D deficiency, unspecified: Secondary | ICD-10-CM

## 2020-09-21 DIAGNOSIS — Z6836 Body mass index (BMI) 36.0-36.9, adult: Secondary | ICD-10-CM

## 2020-09-21 MED ORDER — VITAMIN D (ERGOCALCIFEROL) 1.25 MG (50000 UNIT) PO CAPS: 50000.0000 [IU] | ORAL_CAPSULE | ORAL | 0 refills | Status: DC

## 2020-09-23 MED FILL — ATORVASTATIN 40 MG TABLET: 40 | 90 days supply | Qty: 90 | Fill #0

## 2020-09-23 MED FILL — VIT D2 1.25 MG (50,000 UNIT: 1.25 MG | 28 days supply | Qty: 4 | Fill #0

## 2020-09-23 NOTE — Progress Notes (Addendum)
Chief Complaint:   OBESITY Steven Gillespie is here to discuss his progress with his obesity treatment plan along with follow-up of his obesity related diagnoses. Avrom is on the Category 4 Plan with breakfast options and states he is following his eating plan approximately 85% of the time. [name] states he is exercising 0 minutes 0 times per week.  Today's visit was #: 7 Starting weight: 290 lbs Starting date: 06/16/2020 Today's weight: 280 lbs Today's date: 09/21/2020 Total lbs lost to date: 10 lbs Total lbs lost since last in-office visit: +2 lbs Total weight loss percentage to date: -3.45%  Interim History: Steven Gillespie reports that he ate poorly with his kids over the Christmas break. He thinks he was much worse with his wt gain, except he got back on plan after New Year, which took off some weight at that time.  He said it was worth it and he enjoyed letting go.   Plan:  Jian plans to exercise 5 days a week at 5 AM. He will ride his peloton bike for 45 minutes and also will try mindful meditation for 10-15 minutes every day to help with school stressors.  Assessment/Plan:    1. Vitamin D deficiency Toddrick's Vitamin D level was 31.0 on 06/16/2020. He is currently taking prescription vitamin D 50,000 IU each week. He denies nausea, vomiting or muscle weakness.  Ref. Range 06/16/2020 11:24  Vitamin D, 25-Hydroxy Latest Ref Range: 30.0 - 100.0 ng/mL 31.0   Plan: Refill Vit D for 1 month, as per below. Low Vitamin D level contributes to fatigue and are associated with obesity, breast, and colon cancer. He agrees to continue to take prescription Vitamin D @50 ,000 IU every week and will follow-up for routine testing of Vitamin D, at least 2-3 times per year to avoid over-replacement.  Refill- Vitamin D, Ergocalciferol, (DRISDOL) 1.25 MG (50000 UNIT) CAPS capsule; Take 1 capsule (50,000 Units total) by mouth every 7 (seven) days.  Dispense: 4 capsule; Refill: 0   2. Other  hyperlipidemia Rylee is prescribed Lipitor. He denies issues or cocerns.  [name] has hyperlipidemia and has been trying to improve his cholesterol levels with intensive lifestyle modification including a low saturated fat diet, exercise and weight loss. He denies any chest pain, claudication or myalgias.  Lab Results  Component Value Date   ALT 60 (H) 05/29/2020   AST 33 05/29/2020   ALKPHOS 57 05/29/2020   BILITOT 0.4 05/29/2020   Lab Results  Component Value Date   CHOL 180 05/29/2020   HDL 48.50 05/29/2020   LDLCALC 110 (H) 05/29/2020   LDLDIRECT 111.0 05/29/2020   TRIG 110.0 05/29/2020   CHOLHDL 4 05/29/2020   Plan: I reminded Wayde of health goals, and that it's not just about the number on the scale and to focus on healthy living habits to improve his quality of life and improve disease managment.    3. At risk for heart disease Due to Steven Gillespie's current state of health and medical condition(s), he is at a higher risk for heart disease.   This puts the patient at much greater risk to subsequently develop cardiopulmonary conditions that can significantly affect patient's quality of life in a negative manner as well.    At least 9 minutes was spent on counseling [name] about these concerns today and I stressed the importance of reversing risks factors of obesity, esp truncal and visceral fat, hypertension, hyperlipidemia, pre-diabetes. Initial goal is to lose at least 5-10% of starting weight to  help reduce these risk factors. Counseling: Intensive lifestyle modifications discussed with [name] as most appropriate first line treatment. He will continue to work on diet, exercise and weight loss efforts.  We will continue to reassess these conditions on a fairly regular basis in an attempt to decrease patient's overall morbidity and mortality.  Evidence-based interventions for health behavior change were utilized today including the discussion of self monitoring techniques,  problem-solving barriers and SMART goal setting techniques. Specifically regarding patient's less desirable eating habits and patterns, we employed the technique of small changes when Steven Gillespie has not been able to fully commit to his prudent nutritional plan.  4. Class 2 severe obesity with serious comorbidity and body mass index (BMI) of 36.0 to 36.9 in adult, unspecified obesity type [facility]) [name] is currently in the action stage of change. As such, his goal is to continue with weight loss efforts. He has agreed to the Category 4 Plan with breakfast options without change  Exercise goals: For substantial health benefits, adults should do at least 150 minutes (2 hours and 30 minutes) a week of moderate-intensity, or 75 minutes (1 hour and 15 minutes) a week of vigorous-intensity aerobic physical activity, or an equivalent combination of moderate- and vigorous-intensity aerobic activity. Aerobic activity should be performed in episodes of at least 10 minutes, and preferably, it should be spread throughout the week.  Behavioral modification strategies: I gave Steven Gillespie recipes today and discussed alternative healthy ways to cook items he loves. meal planning and cooking strategies, keeping healthy foods in the home and planning for success.  Steven Gillespie has agreed to follow-up with our clinic in 2 weeks. He was informed of the importance of frequent follow-up visits to maximize his success with intensive lifestyle modifications for his multiple health conditions.   Objective:   Blood pressure 115/78, pulse 77, temperature 97.8 F (36.6 C), height 6\' 2"  (1.88 m), weight 280 lb (127 kg), SpO2 96 %. Body mass index is 35.95 kg/m.  General: Cooperative, alert, well developed, in no acute distress. HEENT: Conjunctivae and lids unremarkable. Cardiovascular: Regular rhythm.  Lungs: Normal work of breathing. Neurologic: No focal deficits.   Lab Results  Component Value Date   CREATININE 1.32 05/29/2020    BUN 20 05/29/2020   NA 139 05/29/2020   K 3.8 05/29/2020   CL 103 05/29/2020   CO2 30 05/29/2020   Lab Results  Component Value Date   ALT 60 (H) 05/29/2020   AST 33 05/29/2020   ALKPHOS 57 05/29/2020   BILITOT 0.4 05/29/2020   Lab Results  Component Value Date   HGBA1C 5.8 (H) 06/16/2020   Lab Results  Component Value Date   INSULIN 29.0 (H) 06/16/2020   Lab Results  Component Value Date   TSH 2.39 05/29/2020   Lab Results  Component Value Date   CHOL 180 05/29/2020   HDL 48.50 05/29/2020   LDLCALC 110 (H) 05/29/2020   LDLDIRECT 111.0 05/29/2020   TRIG 110.0 05/29/2020   CHOLHDL 4 05/29/2020   Lab Results  Component Value Date   WBC 6.0 05/29/2020   HGB 14.8 05/29/2020   HCT 44.3 05/29/2020   MCV 89.8 05/29/2020   PLT 235.0 05/29/2020    Attestation Statements:   Reviewed by clinician on day of visit: allergies, medications, problem list, medical history, surgical history, family history, social history, and previous encounter notes.   Coral Ceo, am acting as Location manager for [employer], DO.  I have reviewed the above documentation for accuracy and completeness,  and I agree with the above. Marjory Sneddon, D.O.  The [place] was signed into law in 2016 which includes the topic of electronic health records.  This provides immediate access to information in MyChart.  This includes consultation notes, operative notes, office notes, lab results and pathology reports.  If you have any questions about what you read please let us know at your next visit so we can discuss your concerns and take corrective action if need be.  We are right here with you.

## 2020-10-05 ENCOUNTER — Ambulatory Visit (INDEPENDENT_AMBULATORY_CARE_PROVIDER_SITE_OTHER): Payer: 59 | Admitting: Family Medicine

## 2020-10-05 ENCOUNTER — Encounter (INDEPENDENT_AMBULATORY_CARE_PROVIDER_SITE_OTHER): Payer: Self-pay | Admitting: Family Medicine

## 2020-10-05 ENCOUNTER — Other Ambulatory Visit: Payer: Self-pay

## 2020-10-05 VITALS — BP 119/77 | HR 82 | Temp 97.9°F | Ht 74.0 in | Wt 280.0 lb

## 2020-10-05 DIAGNOSIS — Z9189 Other specified personal risk factors, not elsewhere classified: Secondary | ICD-10-CM | POA: Diagnosis not present

## 2020-10-05 DIAGNOSIS — R7303 Prediabetes: Secondary | ICD-10-CM

## 2020-10-05 DIAGNOSIS — Z6836 Body mass index (BMI) 36.0-36.9, adult: Secondary | ICD-10-CM | POA: Diagnosis not present

## 2020-10-05 DIAGNOSIS — R7401 Elevation of levels of liver transaminase levels: Secondary | ICD-10-CM | POA: Diagnosis not present

## 2020-10-05 DIAGNOSIS — E7849 Other hyperlipidemia: Secondary | ICD-10-CM | POA: Diagnosis not present

## 2020-10-05 DIAGNOSIS — E559 Vitamin D deficiency, unspecified: Secondary | ICD-10-CM | POA: Diagnosis not present

## 2020-10-06 NOTE — Progress Notes (Signed)
Chief Complaint:   OBESITY Steven Gillespie is here to discuss his progress with his obesity treatment plan along with follow-up of his obesity related diagnoses. Steven Gillespie is on the Category 4 Plan with breakfast options and states he is following his eating plan approximately 80% of the time. Steven Gillespie states he is exercising 0 minutes 0 times per week.  Today's visit was #: 8 Starting weight: 290 lbs Starting date: 06/16/2020 Today's weight: 280 lbs Today's date: 10/05/2020 Total lbs lost to date: 10 lbs Total lbs lost since last in-office visit: 0 Total weight loss percentage to date: -3.45%  Interim History: Pt had a death of a close friend, which required traveling and modification of eating plans, etc. Also, due to weather and COVID stressors the past couple of weeks, he was not able to get exercise in as he wanted to. 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  Assessment/Plan:   1. Pre-diabetes Pt is not on medication at all, but he had an elevated A1c and fasting insulin in the remote past. Steven Gillespie has a diagnosis of prediabetes based on his elevated HgA1c and was informed this puts him at greater risk of developing diabetes. He continues to work on diet and exercise to decrease his risk of diabetes. He denies nausea or hypoglycemia.  Lab Results  Component Value Date   HGBA1C 5.8 (H) 06/16/2020   Lab Results  Component Value Date   INSULIN 29.0 (H) 06/16/2020   Plan: Recheck A1c and fasting insulin. Continue prudent nutritional plan and weight loss to decrease risk of onset diabetes. Try to resume some exercise. Steven Gillespie will continue to work on weight loss, exercise, and decreasing simple carbohydrates to help decrease the risk of diabetes.   Lab/Orders today or future: - Hemoglobin A1c - Insulin, random  2. Other hyperlipidemia Pt is on Lipitor 40 mg and reports no issues or  concerns with meds.  Steven Gillespie has hyperlipidemia and has been trying to improve his cholesterol levels with intensive lifestyle modification including a low saturated fat diet, exercise and weight loss. He denies any chest pain, claudication or myalgias.  Lab Results  Component Value Date   ALT 60 (H) 05/29/2020   AST 33 05/29/2020   ALKPHOS 57 05/29/2020   BILITOT 0.4 05/29/2020   Lab Results  Component Value Date   CHOL 180 05/29/2020   HDL 48.50 05/29/2020   LDLCALC 110 (H) 05/29/2020   LDLDIRECT 111.0 05/29/2020   TRIG 110.0 05/29/2020   CHOLHDL 4 05/29/2020   Plan: Check CMP and FLP. Continue to decrease saturated and trans fats by following meal plan and increasing exercise. Continue med per PCP. Cardiovascular risk and specific lipid/LDL goals reviewed.  We discussed several lifestyle modifications today and Steven Gillespie will continue to work on diet, exercise and weight loss efforts. Orders and follow up as documented in patient record.   Counseling Intensive lifestyle modifications are the first line treatment for this issue. . Dietary changes: Increase soluble fiber. Decrease simple carbohydrates. . Exercise changes: Moderate to vigorous-intensity aerobic activity 150 minutes per week if tolerated. . Lipid-lowering medications: see documented in medical record.  Lab/Orders today or future: - Comprehensive metabolic panel - Lipid panel  3. Elevated ALT measurement In the past, we discussed avoiding alcohol, fatty foods, and hepatotoxic substances like Tylenol in excess.   Lab Results  Component Value Date   ALT 60 (H)  05/29/2020   AST 33 05/29/2020   ALKPHOS 57 05/29/2020   BILITOT 0.4 05/29/2020    Plan: Recheck CMP at next OV. Continue prudent nutritional plan and lifestyle modifications and weight loss.  Lab/Orders today or future: - Comprehensive metabolic panel - Lipid panel  4. Vitamin D deficiency Steven Gillespie Vitamin D level was 31.0 on 06/16/2020. He is  currently taking prescription vitamin D 50,000 IU each week and tolerating it well. He denies nausea, vomiting or muscle weakness. No concerns expressed today.   Ref. Range 06/16/2020 11:24  Vitamin D, 25-Hydroxy Latest Ref Range: 30.0 - 100.0 ng/mL 31.0   Plan: Pt denies need for refill. Low Vitamin D level contributes to fatigue and are associated with obesity, breast, and colon cancer. He agrees to continue to take prescription Vitamin D @50 ,000 IU every week and will follow-up for routine testing of Vitamin D, at least 2-3 times per year to avoid over-replacement.  Lab/Orders today or future: - VITAMIN D 25 Hydroxy (Vit-D Deficiency, Fractures)  5. At risk for impaired metabolic function Due to Steven Gillespie's current state of health and medical condition(s), he is at a significantly higher risk for impaired metabolic function. This places the patient at much greater risk to subsequently develop cardiopulmonary conditions that can negatively affect patient's quality of life as well. At least 9 minutes was spent on counseling Steven Gillespie about these concerns today and I stressed the importance of reversing these risks factors. Initial goal is to lose at least 5-10% of starting weight to help reduce risk factors. Counseling: Intensive lifestyle modifications discussed with Steven Gillespie as most appropriate first line treatment. He will continue to work on diet, exercise and weight loss efforts.  We will continue to reassess these conditions on a fairly regular basis in an attempt to decrease patient's overall morbidity and mortality  6. Class 2 severe obesity with serious comorbidity and body mass index (BMI) of 36.0 to 36.9 in adult, unspecified obesity type [facility]) Steven Gillespie is currently in the action stage of change. As such, his goal is to continue with weight loss efforts. He has agreed to the Category 4 Plan with breakfast options.   Exercise goals: Start with a smaller goal of 10 minutes 3 days a week of peloton  or walking  Behavioral modification strategies: increasing water intake, no skipping meals and planning for success.  Steven Gillespie has agreed to follow-up with our clinic in 2-3 weeks. He was informed of the importance of frequent follow-up visits to maximize his success with intensive lifestyle modifications for his multiple health conditions.   Steven Gillespie was informed we would discuss his lab results at his next visit unless there is a critical issue that needs to be addressed sooner. Steven Gillespie agreed to keep his next visit at the agreed upon time to discuss these results.  Objective:   Blood pressure 119/77, pulse 82, temperature 97.9 F (36.6 C), height 6\' 2"  (1.88 m), weight 280 lb (127 kg), SpO2 96 %. Body mass index is 35.95 kg/m.  General: Cooperative, alert, well developed, in no acute distress. HEENT: Conjunctivae and lids unremarkable. Cardiovascular: Regular rhythm.  Lungs: Normal work of breathing. Neurologic: No focal deficits.   Lab Results  Component Value Date   CREATININE 1.32 05/29/2020   BUN 20 05/29/2020   NA 139 05/29/2020   K 3.8 05/29/2020   CL 103 05/29/2020   CO2 30 05/29/2020   Lab Results  Component Value Date   ALT 60 (H) 05/29/2020   AST 33 05/29/2020  ALKPHOS 57 05/29/2020   BILITOT 0.4 05/29/2020   Lab Results  Component Value Date   HGBA1C 5.8 (H) 06/16/2020   Lab Results  Component Value Date   INSULIN 29.0 (H) 06/16/2020   Lab Results  Component Value Date   TSH 2.39 05/29/2020   Lab Results  Component Value Date   CHOL 180 05/29/2020   HDL 48.50 05/29/2020   LDLCALC 110 (H) 05/29/2020   LDLDIRECT 111.0 05/29/2020   TRIG 110.0 05/29/2020   CHOLHDL 4 05/29/2020   Lab Results  Component Value Date   WBC 6.0 05/29/2020   HGB 14.8 05/29/2020   HCT 44.3 05/29/2020   MCV 89.8 05/29/2020   PLT 235.0 05/29/2020   No results found for: IRON, TIBC, FERRITIN  Attestation Statements:   Reviewed by clinician on day of visit:  allergies, medications, problem list, medical history, surgical history, family history, social history, and previous encounter notes.  Coral Ceo, am acting as Location manager for [employer], DO.  I have reviewed the above documentation for accuracy and completeness, and I agree with the above. Marjory Sneddon, D.O.  The [employer] was signed into law in 2016 which includes the topic of electronic health records.  This provides immediate access to information in MyChart.  This includes consultation notes, operative notes, office notes, lab results and pathology reports.  If you have any questions about what you read please let us know at your next visit so we can discuss your concerns and take corrective action if need be.  We are right here with you.

## 2020-10-15 DIAGNOSIS — R7303 Prediabetes: Secondary | ICD-10-CM | POA: Diagnosis not present

## 2020-10-15 DIAGNOSIS — E7849 Other hyperlipidemia: Secondary | ICD-10-CM | POA: Diagnosis not present

## 2020-10-15 DIAGNOSIS — E559 Vitamin D deficiency, unspecified: Secondary | ICD-10-CM | POA: Diagnosis not present

## 2020-10-15 DIAGNOSIS — R7401 Elevation of levels of liver transaminase levels: Secondary | ICD-10-CM | POA: Diagnosis not present

## 2020-10-16 LAB — COMPREHENSIVE METABOLIC PANEL
ALT: 44 IU/L (ref 0–44)
AST: 24 IU/L (ref 0–40)
Albumin/Globulin Ratio: 1.4 (ref 1.2–2.2)
Albumin: 4.3 g/dL (ref 4.0–5.0)
Alkaline Phosphatase: 61 IU/L (ref 44–121)
BUN/Creatinine Ratio: 18 (ref 9–20)
BUN: 22 mg/dL (ref 6–24)
Bilirubin Total: 0.3 mg/dL (ref 0.0–1.2)
CO2: 24 mmol/L (ref 20–29)
Calcium: 9.5 mg/dL (ref 8.7–10.2)
Chloride: 106 mmol/L (ref 96–106)
Creatinine, Ser: 1.25 mg/dL (ref 0.76–1.27)
GFR calc Af Amer: 77 mL/min/{1.73_m2} (ref >59)
GFR calc non Af Amer: 67 mL/min/{1.73_m2} (ref >59)
Globulin, Total: 3 g/dL (ref 1.5–4.5)
Glucose: 87 mg/dL (ref 65–99)
Potassium: 4.7 mmol/L (ref 3.5–5.2)
Sodium: 142 mmol/L (ref 134–144)
Total Protein: 7.3 g/dL (ref 6.0–8.5)

## 2020-10-16 LAB — INSULIN, RANDOM: INSULIN: 22.6 u[IU]/mL (ref 2.6–24.9)

## 2020-10-16 LAB — VITAMIN D 25 HYDROXY (VIT D DEFICIENCY, FRACTURES): Vit D, 25-Hydroxy: 32.9 ng/mL (ref 30.0–100.0)

## 2020-10-16 LAB — LIPID PANEL
Chol/HDL Ratio: 3.8 ratio (ref 0.0–5.0)
Cholesterol, Total: 173 mg/dL (ref 100–199)
HDL: 46 mg/dL (ref >39)
LDL Chol Calc (NIH): 114 mg/dL — ABNORMAL HIGH (ref 0–99)
Triglycerides: 67 mg/dL (ref 0–149)
VLDL Cholesterol Cal: 13 mg/dL (ref 5–40)

## 2020-10-16 LAB — HEMOGLOBIN A1C
Est. average glucose Bld gHb Est-mCnc: 123 mg/dL
Hgb A1c MFr Bld: 5.9 % — ABNORMAL HIGH (ref 4.8–5.6)

## 2020-10-19 ENCOUNTER — Encounter (INDEPENDENT_AMBULATORY_CARE_PROVIDER_SITE_OTHER): Payer: Self-pay | Admitting: Adult Health

## 2020-10-19 ENCOUNTER — Other Ambulatory Visit: Payer: Self-pay

## 2020-10-19 ENCOUNTER — Ambulatory Visit (INDEPENDENT_AMBULATORY_CARE_PROVIDER_SITE_OTHER): Payer: 59 | Admitting: Adult Health

## 2020-10-19 ENCOUNTER — Other Ambulatory Visit (INDEPENDENT_AMBULATORY_CARE_PROVIDER_SITE_OTHER): Payer: Self-pay | Admitting: Adult Health

## 2020-10-19 VITALS — BP 138/82 | HR 68 | Temp 98.0°F | Ht 74.0 in | Wt 281.0 lb

## 2020-10-19 DIAGNOSIS — Z6836 Body mass index (BMI) 36.0-36.9, adult: Secondary | ICD-10-CM | POA: Diagnosis not present

## 2020-10-19 DIAGNOSIS — E66812 Obesity, class 2: Secondary | ICD-10-CM

## 2020-10-19 DIAGNOSIS — R7303 Prediabetes: Secondary | ICD-10-CM

## 2020-10-19 DIAGNOSIS — E559 Vitamin D deficiency, unspecified: Secondary | ICD-10-CM | POA: Diagnosis not present

## 2020-10-19 DIAGNOSIS — Z9189 Other specified personal risk factors, not elsewhere classified: Secondary | ICD-10-CM | POA: Diagnosis not present

## 2020-10-19 DIAGNOSIS — E7849 Other hyperlipidemia: Secondary | ICD-10-CM | POA: Diagnosis not present

## 2020-10-19 MED ORDER — VITAMIN D (ERGOCALCIFEROL) 1.25 MG (50000 UNIT) PO CAPS
50000.0000 [IU] | ORAL_CAPSULE | ORAL | 0 refills | Status: DC
Start: 2020-10-19 — End: 2020-10-19

## 2020-10-19 MED FILL — VIT D2 1.25 MG (50,000 UNIT: 1.25 MG | 28 days supply | Qty: 4 | Fill #0

## 2020-10-20 NOTE — Progress Notes (Signed)
Chief Complaint:   OBESITY Steven Gillespie is here to discuss his progress with his obesity treatment plan along with follow-up of his obesity related diagnoses. Steven Gillespie is on the Category 4 Plan with breakfast options and states he is following his eating plan approximately 100% of the time. Steven Gillespie states he is cycling 15 minutes 3 times per week.  Today's visit was #: 9 Starting weight: 290 lbs Starting date: 06/16/2020 Today's weight: 281 lbs Today's date: 10/19/2020 Total lbs lost to date: 9 lbs Total lbs lost since last in-office visit: 0  Interim History: Steven Gillespie is often challenged to eat all food on Category 4 meal plan during the day, especially hitting protein goals. He will often have a light protein snack in late evening, in lieu of a full dinner meal.  Interval goal: Ride stationary bike 15 minutes 4 times a week (currently riding 3 times per week at 15 mins intervals).  Subjective:   1. Pre-diabetes Worsening. Discussed labs with patient today. 10/15/2020 A1c worsening at 5.9 with improving insulin level of 22.6. Steven Gillespie is not on Metformin.  Lab Results  Component Value Date   HGBA1C 5.9 (H) 10/15/2020   Lab Results  Component Value Date   INSULIN 22.6 10/15/2020   INSULIN 29.0 (H) 06/16/2020    2. Vitamin D deficiency Discussed labs with patient today. Steven Gillespie's Vitamin D level was 32.9 on 10/15/2020. He is currently taking prescription vitamin D 50,000 IU each week. He denies nausea, vomiting or muscle weakness.   Ref. Range 10/15/2020 08:23  Vitamin D, 25-Hydroxy Latest Ref Range: 30.0 - 100.0 ng/mL 32.9   3. Other hyperlipidemia Discussed labs with patient today. Steven Gillespie is on atorvastatin 40 mg daily. 10/15/2020 Lipid panel resulted LDL is above goal at 114.  PCP manages statin therapy.   Lab Results  Component Value Date   ALT 44 10/15/2020   AST 24 10/15/2020   ALKPHOS 61 10/15/2020   BILITOT 0.3 10/15/2020   Lab Results  Component Value Date   CHOL 173  10/15/2020   HDL 46 10/15/2020   LDLCALC 114 (H) 10/15/2020   LDLDIRECT 111.0 05/29/2020   TRIG 67 10/15/2020   CHOLHDL 3.8 10/15/2020    4. At risk for heart disease Steven Gillespie is at a higher than average risk for cardiovascular disease due to obesity, pre-diabetes, and hyperlipidemia.   Assessment/Plan:   1. Pre-diabetes Steven Gillespie will continue to work on weight loss, exercise, and decreasing simple carbohydrates to help decrease the risk of diabetes. Increased protein, limit simple carbohydrate intake, and increase weekly cycling.  2. Vitamin D deficiency Low Vitamin D level contributes to fatigue and are associated with obesity, breast, and colon cancer. He agrees to continue to take prescription Vitamin D @50 ,000 IU every week and will follow-up for routine testing of Vitamin D, at least 2-3 times per year to avoid over-replacement.  - Vitamin D, Ergocalciferol, (DRISDOL) 1.25 MG (50000 UNIT) CAPS capsule; Take 1 capsule (50,000 Units total) by mouth every 7 (seven) days.  Dispense: 4 capsule; Refill: 0  3. Other hyperlipidemia Cardiovascular risk and specific lipid/LDL goals reviewed.  We discussed several lifestyle modifications today and Steven Gillespie will continue to work on diet, exercise and weight loss efforts. Orders and follow up as documented in patient record. Decrease saturated fat and increase cycling.  Counseling Intensive lifestyle modifications are the first line treatment for this issue. . Dietary changes: Increase soluble fiber. Decrease simple carbohydrates. . Exercise changes: Moderate to vigorous-intensity aerobic activity 150 minutes per  week if tolerated. . Lipid-lowering medications: see documented in medical record.  4. At risk for heart disease Steven Gillespie was given approximately 15 minutes of coronary artery disease prevention counseling today. He is 50 y.o. male and has risk factors for heart disease including obesity. We discussed intensive lifestyle modifications  today with an emphasis on specific weight loss instructions and strategies.   Repetitive spaced learning was employed today to elicit superior memory formation and behavioral change.  5. Class 2 severe obesity with serious comorbidity and body mass index (BMI) of 36.0 to 36.9 in adult, unspecified obesity type [facility]) Steven Gillespie is currently in the action stage of change. As such, his goal is to continue with weight loss efforts. He has agreed to the Category 4 Plan with breakfast options.   Handout: Protein Shake Comparison, Protein Substitutions  Exercise goals: As is  Behavioral modification strategies: increasing lean protein intake, meal planning and cooking strategies and planning for success.  Steven Gillespie has agreed to follow-up with our clinic in 2 weeks. He was informed of the importance of frequent follow-up visits to maximize his success with intensive lifestyle modifications for his multiple health conditions.   Objective:   Blood pressure 138/82, pulse 68, temperature 98 F (36.7 C), height 6\' 2"  (1.88 m), weight 281 lb (127.5 kg), SpO2 96 %. Body mass index is 36.08 kg/m.  General: Cooperative, alert, well developed, in no acute distress. HEENT: Conjunctivae and lids unremarkable. Cardiovascular: Regular rhythm.  Lungs: Normal work of breathing. Neurologic: No focal deficits.   Lab Results  Component Value Date   CREATININE 1.25 10/15/2020   BUN 22 10/15/2020   NA 142 10/15/2020   K 4.7 10/15/2020   CL 106 10/15/2020   CO2 24 10/15/2020   Lab Results  Component Value Date   ALT 44 10/15/2020   AST 24 10/15/2020   ALKPHOS 61 10/15/2020   BILITOT 0.3 10/15/2020   Lab Results  Component Value Date   HGBA1C 5.9 (H) 10/15/2020   HGBA1C 5.8 (H) 06/16/2020   Lab Results  Component Value Date   INSULIN 22.6 10/15/2020   INSULIN 29.0 (H) 06/16/2020   Lab Results  Component Value Date   TSH 2.39 05/29/2020   Lab Results  Component Value Date   CHOL 173  10/15/2020   HDL 46 10/15/2020   LDLCALC 114 (H) 10/15/2020   LDLDIRECT 111.0 05/29/2020   TRIG 67 10/15/2020   CHOLHDL 3.8 10/15/2020   Lab Results  Component Value Date   WBC 6.0 05/29/2020   HGB 14.8 05/29/2020   HCT 44.3 05/29/2020   MCV 89.8 05/29/2020   PLT 235.0 05/29/2020   No results found for: IRON, TIBC, FERRITIN  Attestation Statements:   Reviewed by clinician on day of visit: allergies, medications, problem list, medical history, surgical history, family history, social history, and previous encounter notes.  Coral Ceo, am acting as Location manager for Mina Marble, NP.  I have reviewed the above documentation for accuracy and completeness, and I agree with the above. -  Arnie Clingenpeel d. Desirre Eickhoff, NP-C

## 2020-11-02 ENCOUNTER — Ambulatory Visit (INDEPENDENT_AMBULATORY_CARE_PROVIDER_SITE_OTHER): Payer: 59 | Admitting: Adult Health

## 2020-12-15 ENCOUNTER — Other Ambulatory Visit: Payer: Self-pay | Admitting: Obstetrics and Gynecology

## 2020-12-15 DIAGNOSIS — N920 Excessive and frequent menstruation with regular cycle: Secondary | ICD-10-CM

## 2020-12-15 DIAGNOSIS — D259 Leiomyoma of uterus, unspecified: Secondary | ICD-10-CM

## 2020-12-22 ENCOUNTER — Other Ambulatory Visit (INDEPENDENT_AMBULATORY_CARE_PROVIDER_SITE_OTHER): Payer: Self-pay | Admitting: Adult Health

## 2020-12-22 ENCOUNTER — Other Ambulatory Visit (HOSPITAL_COMMUNITY): Payer: Self-pay

## 2020-12-22 DIAGNOSIS — E559 Vitamin D deficiency, unspecified: Secondary | ICD-10-CM

## 2020-12-22 MED FILL — Atorvastatin Calcium Tab 40 MG (Base Equivalent): ORAL | 90 days supply | Qty: 90 | Fill #0 | Status: AC

## 2020-12-23 ENCOUNTER — Other Ambulatory Visit (INDEPENDENT_AMBULATORY_CARE_PROVIDER_SITE_OTHER): Payer: Self-pay | Admitting: Adult Health

## 2020-12-23 DIAGNOSIS — E559 Vitamin D deficiency, unspecified: Secondary | ICD-10-CM

## 2020-12-24 ENCOUNTER — Other Ambulatory Visit (HOSPITAL_COMMUNITY): Payer: Self-pay

## 2020-12-26 ENCOUNTER — Other Ambulatory Visit (HOSPITAL_COMMUNITY): Payer: Self-pay

## 2021-01-01 ENCOUNTER — Other Ambulatory Visit (HOSPITAL_COMMUNITY): Payer: Self-pay

## 2021-04-12 DIAGNOSIS — H52223 Regular astigmatism, bilateral: Secondary | ICD-10-CM | POA: Diagnosis not present

## 2021-04-12 DIAGNOSIS — H524 Presbyopia: Secondary | ICD-10-CM | POA: Diagnosis not present

## 2021-04-12 DIAGNOSIS — H5203 Hypermetropia, bilateral: Secondary | ICD-10-CM | POA: Diagnosis not present

## 2021-04-28 ENCOUNTER — Other Ambulatory Visit (HOSPITAL_COMMUNITY): Payer: Self-pay

## 2021-04-28 MED FILL — Atorvastatin Calcium Tab 40 MG (Base Equivalent): ORAL | 90 days supply | Qty: 90 | Fill #1 | Status: AC

## 2021-07-05 ENCOUNTER — Encounter: Payer: Self-pay | Admitting: Family Medicine

## 2021-07-16 ENCOUNTER — Other Ambulatory Visit (HOSPITAL_COMMUNITY): Payer: Self-pay

## 2021-07-16 MED FILL — Atorvastatin Calcium Tab 40 MG (Base Equivalent): ORAL | 90 days supply | Qty: 90 | Fill #2 | Status: AC

## 2021-08-02 ENCOUNTER — Other Ambulatory Visit: Payer: Self-pay

## 2021-08-03 ENCOUNTER — Ambulatory Visit (INDEPENDENT_AMBULATORY_CARE_PROVIDER_SITE_OTHER): Payer: 59

## 2021-08-03 ENCOUNTER — Encounter: Payer: Self-pay | Admitting: Family Medicine

## 2021-08-03 ENCOUNTER — Other Ambulatory Visit (HOSPITAL_COMMUNITY): Payer: Self-pay

## 2021-08-03 ENCOUNTER — Ambulatory Visit: Payer: 59 | Admitting: Family Medicine

## 2021-08-03 ENCOUNTER — Other Ambulatory Visit: Payer: Self-pay | Admitting: Family Medicine

## 2021-08-03 VITALS — BP 130/72 | HR 93 | Temp 97.1°F | Ht 74.0 in | Wt 297.2 lb

## 2021-08-03 DIAGNOSIS — R7303 Prediabetes: Secondary | ICD-10-CM

## 2021-08-03 DIAGNOSIS — M25562 Pain in left knee: Secondary | ICD-10-CM

## 2021-08-03 DIAGNOSIS — M25561 Pain in right knee: Secondary | ICD-10-CM

## 2021-08-03 DIAGNOSIS — M1712 Unilateral primary osteoarthritis, left knee: Secondary | ICD-10-CM | POA: Diagnosis not present

## 2021-08-03 DIAGNOSIS — G8929 Other chronic pain: Secondary | ICD-10-CM | POA: Diagnosis not present

## 2021-08-03 MED ORDER — DICLOFENAC SODIUM 1 % EX GEL
CUTANEOUS | 2 refills | Status: DC
  Filled 2021-08-03: qty 100, 20d supply, fill #0
  Filled 2021-09-15: qty 100, 20d supply, fill #1
  Filled 2022-04-19: qty 100, 20d supply, fill #2

## 2021-08-03 NOTE — Progress Notes (Signed)
Established Patient Office Visit  Subjective:  Patient ID: Steven Gillespie, male    DOB: Jan 05, 1971  Age: 50 y.o. MRN: [id number]  CC:  Chief Complaint  Patient presents with   Pain    Left knee pain x 6 months becoming worse.     HPI [name] presents for evaluation of right knee pain.  It is been present for months.  Certain movements seem to exacerbate it.  Denies instability or locking up.  No particular injury history.  Diagnosed with prediabetes back in February.  He has gained weight since that time.  He continues with graduate school.  Past Medical History:  Diagnosis Date   Asthma    as child    Chicken pox    Fatigue    Hyperlipidemia    Shortness of breath on exertion     Past Surgical History:  Procedure Laterality Date   KNEE ARTHROSCOPY  1999   NASAL RECONSTRUCTION      Family History  Problem Relation Age of Onset   Hyperlipidemia Mother    Asthma Father    Cancer Father    Drug abuse Father    Hearing loss Father    Heart disease Father    Heart attack Father    Hypertension Father    Colon cancer Neg Hx    Colon polyps Neg Hx    Esophageal cancer Neg Hx    Rectal cancer Neg Hx    Stomach cancer Neg Hx     Social History   Socioeconomic History   Marital status: Married    Spouse name: [name]   Number of children: 2   Years of education: Not on file   Highest education level: Not on file  Occupational History   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    Employer: [employer]  Tobacco Use   Smoking status: Never   Smokeless tobacco: Never   Tobacco comments:    occassionally smokes cigar  Vaping Use   Vaping Use: Never used  Substance and Sexual Activity   Alcohol use: Yes    Comment: 2 drinks every other week per pt   Drug use: No   Sexual activity: Yes    Partners: Female  Other Topics Concern   Not on file  Social History Narrative   Not on file   Social Determinants of Health   Financial Resource Strain:  Not on file  Food Insecurity: Not on file  Transportation Needs: Not on file  Physical Activity: Not on file  Stress: Not on file  Social Connections: Not on file  Intimate Partner Violence: Not on file    Outpatient Medications Prior to Visit  Medication Sig Dispense Refill   atorvastatin (LIPITOR) 40 MG tablet TAKE 1 TABLET (40 MG TOTAL) BY MOUTH DAILY. 90 tablet 3   Vitamin D, Ergocalciferol, (DRISDOL) 1.25 MG (50000 UNIT) CAPS capsule TAKE 1 CAPSULE BY MOUTH EVERY 7 DAYS. 4 capsule 0   No facility-administered medications prior to visit.    Allergies  Allergen Reactions   Crestor [Rosuvastatin Calcium]     Headache    ROS Review of Systems  Constitutional:  Negative for diaphoresis, fatigue, fever and unexpected weight change.  HENT: Negative.    Eyes:  Negative for photophobia and visual disturbance.  Respiratory: Negative.    Cardiovascular: Negative.   Gastrointestinal: Negative.   Musculoskeletal:  Positive for arthralgias and gait problem.  Neurological:  Negative for speech difficulty and weakness.  Psychiatric/Behavioral: Negative.  Objective:    Physical Exam Vitals and nursing note reviewed.  Constitutional:      General: He is not in acute distress.    Appearance: Normal appearance. He is obese. He is not ill-appearing, toxic-appearing or diaphoretic.  HENT:     Head: Normocephalic and atraumatic.     Right Ear: External ear normal.     Left Ear: External ear normal.  Eyes:     General: No scleral icterus.       Right eye: No discharge.        Left eye: No discharge.     Extraocular Movements: Extraocular movements intact.     Conjunctiva/sclera: Conjunctivae normal.  Pulmonary:     Effort: Pulmonary effort is normal.  Musculoskeletal:     Right knee: Swelling and effusion present. No erythema. Normal range of motion. Tenderness present over the medial joint line. No lateral joint line tenderness. No ACL laxity or PCL laxity. Normal meniscus  and normal patellar mobility.     Comments: Right knee with small effusion with mild swelling. small effusion.  Tenderness to palpation along the medial joint line.  Negative grind test.  Skin:    General: Skin is warm and dry.  Neurological:     Mental Status: He is alert and oriented to person, place, and time.  Psychiatric:        Mood and Affect: Mood normal.        Behavior: Behavior normal.    BP 130/72 (BP Location: Right Arm, Patient Position: Sitting, Cuff Size: Large)   Pulse 93   Temp (!) 97.1 F (36.2 C) (Temporal)   Ht 6\' 2"  (1.88 m)   Wt 297 lb 3.2 oz (134.8 kg)   SpO2 94%   BMI 38.16 kg/m  Wt Readings from Last 3 Encounters:  08/03/21 297 lb 3.2 oz (134.8 kg)  10/19/20 281 lb (127.5 kg)  10/05/20 280 lb (127 kg)     Health Maintenance Due  Topic Date Due   Pneumococcal Vaccine 28-60 Years old (1 - PCV) Never done   TETANUS/TDAP  Never done   Zoster Vaccines- Shingrix (1 of 2) Never done    There are no preventive care reminders to display for this patient.  Lab Results  Component Value Date   TSH 2.39 05/29/2020   Lab Results  Component Value Date   WBC 6.0 05/29/2020   HGB 14.8 05/29/2020   HCT 44.3 05/29/2020   MCV 89.8 05/29/2020   PLT 235.0 05/29/2020   Lab Results  Component Value Date   NA 142 10/15/2020   K 4.7 10/15/2020   CO2 24 10/15/2020   GLUCOSE 87 10/15/2020   BUN 22 10/15/2020   CREATININE 1.25 10/15/2020   BILITOT 0.3 10/15/2020   ALKPHOS 61 10/15/2020   AST 24 10/15/2020   ALT 44 10/15/2020   PROT 7.3 10/15/2020   ALBUMIN 4.3 10/15/2020   CALCIUM 9.5 10/15/2020   GFR 69.38 05/29/2020   Lab Results  Component Value Date   CHOL 173 10/15/2020   Lab Results  Component Value Date   HDL 46 10/15/2020   Lab Results  Component Value Date   LDLCALC 114 (H) 10/15/2020   Lab Results  Component Value Date   TRIG 67 10/15/2020   Lab Results  Component Value Date   CHOLHDL 3.8 10/15/2020   Lab Results  Component  Value Date   HGBA1C 5.9 (H) 10/15/2020      Assessment & Plan:   Problem List  Items Addressed This Visit       Other   Prediabetes - Primary   Other Visit Diagnoses     Right knee pain, unspecified chronicity       Relevant Medications   diclofenac Sodium (VOLTAREN) 1 % GEL   Other Relevant Orders   DG Knee Complete 4 Views Right       Meds ordered this encounter  Medications   diclofenac Sodium (VOLTAREN) 1 % GEL    Sig: Apply a small grape sized dollop to tender spot on the up to 4 times daily.    Dispense:  150 g    Refill:  2    Follow-up: No follow-ups on file.  Will check x-ray of knee today.  Suspect osteoarthritic changes.  Will use Voltaren gel 4 times daily as needed.  Given information on chronic knee pain.  May need sports medicine referral.  Expressed my concern about his prediabetes and lack of recent follow-up with a 16 pound weight gain.  Advised that he may have developed diabetes.  He will make an appointment soon with his primary care doctor.  Advised that weight loss would help his diabetes and his knee.  Libby Maw, MD

## 2021-09-15 ENCOUNTER — Other Ambulatory Visit (HOSPITAL_COMMUNITY): Payer: Self-pay

## 2021-09-17 ENCOUNTER — Encounter: Payer: Self-pay | Admitting: Family Medicine

## 2021-09-17 ENCOUNTER — Other Ambulatory Visit (HOSPITAL_COMMUNITY): Payer: Self-pay

## 2021-09-17 DIAGNOSIS — M25561 Pain in right knee: Secondary | ICD-10-CM

## 2021-09-17 MED ORDER — MELOXICAM 15 MG PO TABS
15.0000 mg | ORAL_TABLET | Freq: Every day | ORAL | 0 refills | Status: DC
  Filled 2021-09-17: qty 30, 30d supply, fill #0

## 2021-09-29 ENCOUNTER — Ambulatory Visit: Payer: 59 | Admitting: Family Medicine

## 2021-09-29 ENCOUNTER — Encounter: Payer: Self-pay | Admitting: Family Medicine

## 2021-09-29 VITALS — BP 129/85 | Ht 75.0 in | Wt 280.0 lb

## 2021-09-29 DIAGNOSIS — G8929 Other chronic pain: Secondary | ICD-10-CM

## 2021-09-29 DIAGNOSIS — M25562 Pain in left knee: Secondary | ICD-10-CM

## 2021-09-29 NOTE — Progress Notes (Signed)
PCP: Libby Maw, MD  Subjective:   HPI: Patient is a 51 y.o. male here for left knee pain.  Patient reports he's had off and on deep left knee pain since May. Initial used ice and heat which improved his pain. Flared up again in October and intermittently since then. Bothers more with prolonged sitting then going to get up. No benefit with voltaren gel - just started meloxicam which seems to help and he takes as needed. No locking, giving out.  Past Medical History:  Diagnosis Date   Asthma    as child    Chicken pox    Fatigue    Hyperlipidemia    Shortness of breath on exertion     Current Outpatient Medications on File Prior to Visit  Medication Sig Dispense Refill   atorvastatin (LIPITOR) 40 MG tablet TAKE 1 TABLET (40 MG TOTAL) BY MOUTH DAILY. 90 tablet 3   diclofenac Sodium (VOLTAREN) 1 % GEL Apply a small grape sized dollop to tender spot up to 4 times daily as directed. 150 g 2   meloxicam (MOBIC) 15 MG tablet Take 1 tablet (15 mg total) by mouth daily. 30 tablet 0   No current facility-administered medications on file prior to visit.    Past Surgical History:  Procedure Laterality Date   KNEE ARTHROSCOPY  1999   NASAL RECONSTRUCTION      Allergies  Allergen Reactions   Crestor [Rosuvastatin Calcium]     Headache    BP 129/85    Ht 6\' 3"  (1.905 m)    Wt 280 lb (127 kg)    BMI 35.00 kg/m   [facility] 09/29/2021  Frequency of aerobic exercise (# of days/week) 3  Average time in minutes 30  Frequency of strengthening activities (# of days/week) 0    No flowsheet data found.      Objective:  Physical Exam:  Gen: NAD, comfortable in exam room  Left knee: No gross deformity, ecchymoses.  Minimal effusion. No TTP joint lines, post patellar facets. FROM with normal strength. Negative ant/post drawers. Negative valgus/varus testing. Negative lachman.  Mild pain mcmurrays, apleys.  Pain with thessalys. NV intact  distally.   Assessment & Plan:  1. Left knee pain - radiographs reviewed and noted moderate medial arthritis.  Pain consistent with this though discussed possible he has concurrent degenerative meniscus tear.  Both are treated similarly though.  Home exercises reviewed.  Meloxicam as needed.  Tylenol, topical medications, supplements reviewed also.  Consider injection if pain becomes severe.  Consider physical therapy as well.  F/u prn if he improves.

## 2021-09-29 NOTE — Patient Instructions (Signed)
Your pain is due to arthritis, less likely a degenerative medial meniscus tear. Both are treated similarly. These are the different medications you can take for this: Tylenol 500mg  1-2 tabs three times a day for pain. Capsaicin, aspercreme, or biofreeze topically up to four times a day may also help with pain. Some supplements that may help for arthritis: Boswellia extract, curcumin, pycnogenol Meloxicam 15mg  daily with food as needed. Cortisone injections are an option if pain is severe. If cortisone injections do not help, there are different types of shots that may help but they take longer to take effect. It's important that you continue to stay active. Straight leg raises, knee extensions 3 sets of 10 once a day (add ankle weight if these become too easy). Consider physical therapy to strengthen muscles around the joint that hurts to take pressure off of the joint itself. Shoe inserts with good arch support may be helpful. Heat or ice 15 minutes at a time 3-4 times a day as needed to help with pain. Water aerobics and cycling with low resistance are the best two types of exercise for arthritis though any exercise is ok as long as it doesn't worsen the pain. Follow up with me as needed.

## 2021-11-04 ENCOUNTER — Other Ambulatory Visit: Payer: Self-pay | Admitting: Family Medicine

## 2021-11-04 ENCOUNTER — Other Ambulatory Visit: Payer: Self-pay | Admitting: Family

## 2021-11-04 ENCOUNTER — Other Ambulatory Visit (HOSPITAL_COMMUNITY): Payer: Self-pay

## 2021-11-04 DIAGNOSIS — E7800 Pure hypercholesterolemia, unspecified: Secondary | ICD-10-CM

## 2021-11-04 DIAGNOSIS — M25561 Pain in right knee: Secondary | ICD-10-CM

## 2021-11-04 MED ORDER — MELOXICAM 15 MG PO TABS
15.0000 mg | ORAL_TABLET | Freq: Every day | ORAL | 0 refills | Status: DC
  Filled 2021-11-04: qty 30, 30d supply, fill #0

## 2021-11-05 ENCOUNTER — Other Ambulatory Visit (HOSPITAL_COMMUNITY): Payer: Self-pay

## 2022-04-19 ENCOUNTER — Other Ambulatory Visit: Payer: Self-pay | Admitting: Family

## 2022-04-19 ENCOUNTER — Other Ambulatory Visit (HOSPITAL_COMMUNITY): Payer: Self-pay

## 2022-04-19 ENCOUNTER — Other Ambulatory Visit: Payer: Self-pay | Admitting: Family Medicine

## 2022-04-19 DIAGNOSIS — M25561 Pain in right knee: Secondary | ICD-10-CM

## 2022-04-19 DIAGNOSIS — E7800 Pure hypercholesterolemia, unspecified: Secondary | ICD-10-CM

## 2022-04-20 ENCOUNTER — Encounter (INDEPENDENT_AMBULATORY_CARE_PROVIDER_SITE_OTHER): Payer: Self-pay

## 2022-04-20 ENCOUNTER — Other Ambulatory Visit (HOSPITAL_COMMUNITY): Payer: Self-pay

## 2022-04-29 ENCOUNTER — Other Ambulatory Visit (HOSPITAL_COMMUNITY): Payer: Self-pay

## 2022-04-29 ENCOUNTER — Other Ambulatory Visit: Payer: Self-pay | Admitting: Family

## 2022-04-29 DIAGNOSIS — E7800 Pure hypercholesterolemia, unspecified: Secondary | ICD-10-CM

## 2022-05-07 ENCOUNTER — Other Ambulatory Visit: Payer: Self-pay | Admitting: Family

## 2022-05-07 ENCOUNTER — Other Ambulatory Visit (HOSPITAL_COMMUNITY): Payer: Self-pay

## 2022-05-07 DIAGNOSIS — E7800 Pure hypercholesterolemia, unspecified: Secondary | ICD-10-CM

## 2022-05-10 DIAGNOSIS — H524 Presbyopia: Secondary | ICD-10-CM | POA: Diagnosis not present

## 2022-05-10 DIAGNOSIS — H5203 Hypermetropia, bilateral: Secondary | ICD-10-CM | POA: Diagnosis not present

## 2022-05-10 DIAGNOSIS — Z135 Encounter for screening for eye and ear disorders: Secondary | ICD-10-CM | POA: Diagnosis not present

## 2022-05-10 DIAGNOSIS — H52223 Regular astigmatism, bilateral: Secondary | ICD-10-CM | POA: Diagnosis not present

## 2022-05-24 ENCOUNTER — Other Ambulatory Visit: Payer: Self-pay | Admitting: Family

## 2022-05-24 ENCOUNTER — Other Ambulatory Visit (HOSPITAL_COMMUNITY): Payer: Self-pay

## 2022-05-24 DIAGNOSIS — E7800 Pure hypercholesterolemia, unspecified: Secondary | ICD-10-CM

## 2022-06-07 ENCOUNTER — Other Ambulatory Visit (HOSPITAL_COMMUNITY): Payer: Self-pay

## 2022-08-01 ENCOUNTER — Other Ambulatory Visit (HOSPITAL_COMMUNITY): Payer: Self-pay

## 2022-08-12 ENCOUNTER — Other Ambulatory Visit (HOSPITAL_COMMUNITY): Payer: Self-pay

## 2022-08-12 ENCOUNTER — Other Ambulatory Visit: Payer: Self-pay | Admitting: Family

## 2022-08-12 ENCOUNTER — Other Ambulatory Visit: Payer: Self-pay | Admitting: Family Medicine

## 2022-08-12 DIAGNOSIS — M25561 Pain in right knee: Secondary | ICD-10-CM

## 2022-08-12 DIAGNOSIS — E7800 Pure hypercholesterolemia, unspecified: Secondary | ICD-10-CM

## 2022-08-12 MED ORDER — MELOXICAM 15 MG PO TABS
15.0000 mg | ORAL_TABLET | Freq: Every day | ORAL | 0 refills | Status: DC
  Filled 2022-08-12: qty 30, 30d supply, fill #0

## 2022-08-16 ENCOUNTER — Telehealth: Payer: 59 | Admitting: Family

## 2022-08-16 DIAGNOSIS — L739 Follicular disorder, unspecified: Secondary | ICD-10-CM | POA: Diagnosis not present

## 2022-08-16 MED ORDER — CEPHALEXIN 500 MG PO CAPS: 500.0000 mg | ORAL_CAPSULE | Freq: Three times a day (TID) | ORAL | 0 refills | Status: DC

## 2022-08-16 MED ORDER — TRIAMCINOLONE ACETONIDE 0.025 % EX OINT: 1.0000 | TOPICAL_OINTMENT | Freq: Two times a day (BID) | CUTANEOUS | 0 refills | Status: DC

## 2022-08-16 NOTE — Progress Notes (Signed)
Virtual Visit Consent   Steven Gillespie, you are scheduled for a virtual visit with a [facility] provider today. Just as with appointments in the office, your consent must be obtained to participate. Your consent will be active for this visit and any virtual visit you may have with one of our providers in the next 365 days. If you have a MyChart account, a copy of this consent can be sent to you electronically.  As this is a virtual visit, video technology does not allow for your provider to perform a traditional examination. This may limit your provider's ability to fully assess your condition. If your provider identifies any concerns that need to be evaluated in person or the need to arrange testing (such as labs, EKG, etc.), we will make arrangements to do so. Although advances in technology are sophisticated, we cannot ensure that it will always work on either your end or our end. If the connection with a video visit is poor, the visit may have to be switched to a telephone visit. With either a video or telephone visit, we are not always able to ensure that we have a secure connection.  By engaging in this virtual visit, you consent to the provision of healthcare and authorize for your insurance to be billed (if applicable) for the services provided during this visit. Depending on your insurance coverage, you may receive a charge related to this service.  I need to obtain your verbal consent now. Are you willing to proceed with your visit today? Steven Gillespie has provided verbal consent on 08/16/2022 for a virtual visit (video or telephone). Steven Dun, FNP  Date: 08/16/2022 5:13 PM  Virtual Visit via Video Note   I, Steven Gillespie, connected with  Steven Gillespie  ([id number], 09/09/1971) on 08/16/22 at  5:00 PM EST by a video-enabled telemedicine application and verified that I am speaking with the correct person using two identifiers.  Location: Patient: Virtual Visit Location Patient:  Work Provider: Scientist, research (medical) Provider: Home Office   I discussed the limitations of evaluation and management by telemedicine and the availability of in person appointments. The patient expressed understanding and agreed to proceed.    History of Present Illness: Steven Gillespie is a 51 y.o. who identifies as a male who was assigned male at birth, and is being seen today for rash on face after shaving.  HPI: Rash This is a new problem. The current episode started in the past 7 days. The problem is unchanged. The affected locations include the face. The rash is characterized by itchiness, swelling and redness. He was exposed to nothing. Past treatments include anti-itch cream. The treatment provided mild relief.    Problems:  Patient Active Problem List   Diagnosis Date Noted   At risk for impaired metabolic function 40/81/4481   At risk for heart disease 09/21/2020   Other hyperlipidemia 09/01/2020   Prediabetes 09/01/2020   At risk for dehydration 08/13/2020   Vitamin D deficiency 07/14/2020   Fatigue 05/30/2020   Elevated ALT measurement 05/30/2020   Need for influenza vaccination 05/29/2020   Elevated LDL cholesterol level 08/31/2018   Healthcare maintenance 08/28/2018   Atypical nevi 08/28/2018   Class 2 severe obesity with serious comorbidity and body mass index (BMI) of 36.0 to 36.9 in adult [facility]) 08/28/2018   Otitis media 05/20/2014    Allergies:  Allergies  Allergen Reactions   Crestor [Rosuvastatin Calcium]     Headache   Medications:  Current  Outpatient Medications:    cephALEXin (KEFLEX) 500 MG capsule, Take 1 capsule (500 mg total) by mouth 3 (three) times daily., Disp: 21 capsule, Rfl: 0   triamcinolone (KENALOG) 0.025 % ointment, Apply 1 Application topically 2 (two) times daily., Disp: 30 g, Rfl: 0   atorvastatin (LIPITOR) 40 MG tablet, TAKE 1 TABLET (40 MG TOTAL) BY MOUTH DAILY., Disp: 90 tablet, Rfl: 3   diclofenac Sodium (VOLTAREN) 1 % GEL, Apply a  small grape sized dollop to tender spot up to 4 times daily as directed., Disp: 150 g, Rfl: 2   meloxicam (MOBIC) 15 MG tablet, Take 1 tablet by mouth daily., Disp: 30 tablet, Rfl: 0  Observations/Objective: Patient is well-developed, well-nourished in no acute distress.  Resting comfortably  Head is normocephalic, atraumatic.  No labored breathing.  Speech is clear and coherent with logical content.  Patient is alert and oriented at baseline.  Slightly erythemas papule rash on bilateral cheek and neck   Assessment and Plan: 1. Folliculitis - cephALEXin (KEFLEX) 500 MG capsule; Take 1 capsule (500 mg total) by mouth 3 (three) times daily.  Dispense: 21 capsule; Refill: 0 - triamcinolone (KENALOG) 0.025 % ointment; Apply 1 Application topically 2 (two) times daily.  Dispense: 30 g; Refill: 0  New razor Keep clean and dry Avoid scratching  Follow up if symptoms worsen or do not improve   Follow Up Instructions: I discussed the assessment and treatment plan with the patient. The patient was provided an opportunity to ask questions and all were answered. The patient agreed with the plan and demonstrated an understanding of the instructions.  A copy of instructions were sent to the patient via MyChart unless otherwise noted below.     The patient was advised to call back or seek an in-person evaluation if the symptoms worsen or if the condition fails to improve as anticipated.  Time:  I spent 8 minutes with the patient via telehealth technology discussing the above problems/concerns.    Steven Dun, FNP

## 2022-08-16 NOTE — Patient Instructions (Signed)
Folliculitis  Folliculitis occurs when hair follicles become inflamed. A hair follicle is a tiny opening in your skin where your hair grows from. This condition often occurs on the scalp, thighs, legs, back, and buttocks but can happen anywhere on the body. What are the causes? A common cause of this condition is an infection from bacteria. The type of folliculitis caused by bacteria can last a long time or go away and come back. The bacteria can live anywhere on your skin. They are often found in the nostrils. Other causes may include: An infection from a fungus. An infection from a virus. Your skin touching some chemicals, such as oils and tars. Shaving or waxing. Greasy ointments or creams put on the skin. What increases the risk? You are more likely to develop this condition if: Your body has a weak disease-fighting system (immune system). You have diabetes. You are obese. What are the signs or symptoms? Symptoms of this condition include: Redness. Soreness. Swelling. Itching. Small white or yellow, itchy spots filled with pus (pustules) that appear over a red area. If the infection goes deep into the follicle, these may turn into a boil (furuncle). A group of boils (carbuncle). These tend to form in hairy, sweaty areas of the body. How is this diagnosed? This condition is diagnosed with a skin exam. Your health care provider may take a sample of one of the pustules or boils to test in a lab. How is this treated? This condition may be treated by: Putting a warm, wet cloth (warm compress) on the affected areas. Taking antibiotics or applying them to the skin. Applying or bathing with a solution that kills germs (antiseptic). Taking an over-the-counter medicine. This can help with itching. Having a procedure to drain pustules or boils. This may be done if a pustule or boil contains a lot of pus or fluid. Having laser hair removal. This may be done when the condition lasts for a  long time. Follow these instructions at home: Managing pain and swelling  If directed, apply heat to the affected area as often as told by your health care provider. Use the heat source that your health care provider recommends, such as a moist heat pack or a heating pad. Place a towel between your skin and the heat source. Leave the heat on for 20-30 minutes. If your skin turns bright red, remove the heat right away to prevent burns. The risk of burns is higher if you cannot feel pain, heat, or cold. General instructions Take over-the-counter and prescription medicines only as told by your health care provider. If you were prescribed antibiotics, take or apply them as told by your health care provider. Do not stop using the antibiotic even if you start to feel better. Check your irritated area every day for signs of infection. Check for: More redness, swelling, or pain. Fluid or blood. Warmth. Pus or a bad smell. Do not shave irritated skin. Keep all follow-up visits. Your health care provider will check if the treatments are helping. Contact a health care provider if: You have a fever. You have any signs of infection. Red streaks are spreading from the affected area. This information is not intended to replace advice given to you by your health care provider. Make sure you discuss any questions you have with your health care provider. Document Revised: 02/01/2022 Document Reviewed: 02/01/2022 Elsevier Patient Education  2023 Elsevier Inc.  

## 2022-08-19 ENCOUNTER — Ambulatory Visit: Payer: 59 | Admitting: Family Medicine

## 2022-09-15 ENCOUNTER — Encounter: Payer: Self-pay | Admitting: Family Medicine

## 2022-09-19 ENCOUNTER — Other Ambulatory Visit (HOSPITAL_COMMUNITY): Payer: Self-pay

## 2022-09-19 ENCOUNTER — Encounter: Payer: Self-pay | Admitting: Family Medicine

## 2022-09-19 ENCOUNTER — Ambulatory Visit: Payer: Commercial Managed Care - PPO | Admitting: Family Medicine

## 2022-09-19 VITALS — BP 130/78 | HR 91 | Temp 97.5°F | Ht 75.0 in | Wt 295.0 lb

## 2022-09-19 DIAGNOSIS — Z9189 Other specified personal risk factors, not elsewhere classified: Secondary | ICD-10-CM | POA: Diagnosis not present

## 2022-09-19 DIAGNOSIS — E7800 Pure hypercholesterolemia, unspecified: Secondary | ICD-10-CM | POA: Diagnosis not present

## 2022-09-19 DIAGNOSIS — E559 Vitamin D deficiency, unspecified: Secondary | ICD-10-CM | POA: Diagnosis not present

## 2022-09-19 DIAGNOSIS — R7303 Prediabetes: Secondary | ICD-10-CM | POA: Diagnosis not present

## 2022-09-19 DIAGNOSIS — N5201 Erectile dysfunction due to arterial insufficiency: Secondary | ICD-10-CM

## 2022-09-19 MED ORDER — VITAMIN D 25 MCG (1000 UNIT) PO TABS
1000.0000 [IU] | ORAL_TABLET | Freq: Every day | ORAL | 3 refills | Status: DC
  Filled 2022-09-19: qty 90, 90d supply, fill #0
  Filled 2022-12-15 – 2022-12-27 (×3): qty 90, 90d supply, fill #1
  Filled 2023-03-24: qty 90, 90d supply, fill #2
  Filled 2023-07-17: qty 90, 90d supply, fill #3

## 2022-09-19 MED ORDER — TADALAFIL 20 MG PO TABS
10.0000 mg | ORAL_TABLET | ORAL | 11 refills | Status: DC | PRN
  Filled 2022-09-19: qty 10, 20d supply, fill #0
  Filled 2022-11-15: qty 10, 20d supply, fill #1
  Filled 2022-12-15: qty 10, 20d supply, fill #2
  Filled 2023-03-02: qty 10, 20d supply, fill #3
  Filled 2023-03-24: qty 10, 50d supply, fill #4
  Filled 2023-04-12 – 2023-06-08 (×4): qty 10, 50d supply, fill #5
  Filled 2023-07-17: qty 10, 50d supply, fill #6
  Filled 2023-08-24 – 2023-08-28 (×2): qty 10, 50d supply, fill #7

## 2022-09-19 MED ORDER — ATORVASTATIN CALCIUM 40 MG PO TABS
ORAL_TABLET | Freq: Every day | ORAL | 3 refills | Status: DC
  Filled 2022-09-19: qty 90, 90d supply, fill #0
  Filled 2022-12-15: qty 90, 90d supply, fill #1
  Filled 2023-03-24: qty 90, 90d supply, fill #2
  Filled 2023-07-17: qty 90, 90d supply, fill #3

## 2022-09-19 NOTE — Progress Notes (Signed)
Established Patient Office Visit   Subjective:  Patient ID: Steven Gillespie, male    DOB: Jun 02, 1971  Age: 52 y.o. MRN: [id number]  Chief Complaint  Patient presents with   Follow-up    Routine follow up, refill on medications.     HPI Encounter Diagnoses  Name Primary?   Prediabetes Yes   Elevated LDL cholesterol level    At risk for heart disease    Vitamin D deficiency    Erectile dysfunction due to arterial insufficiency    Follow-up of elevated cholesterol with strong family history of coronary artery disease.  He is not fasting today.  He has been exercising and eating healthily the last several months.  Of atorvastatin for 5 months now.  There has been some issue with the firmness of his erections.  Libido is excellent.   Review of Systems  Constitutional: Negative.   HENT: Negative.    Eyes:  Negative for blurred vision, discharge and redness.  Respiratory: Negative.    Cardiovascular: Negative.   Gastrointestinal:  Negative for abdominal pain.  Genitourinary: Negative.   Musculoskeletal: Negative.  Negative for myalgias.  Skin:  Negative for rash.  Neurological:  Negative for tingling, loss of consciousness and weakness.  Endo/Heme/Allergies:  Negative for polydipsia.     Current Outpatient Medications:    diclofenac Sodium (VOLTAREN) 1 % GEL, Apply a small grape sized dollop to tender spot up to 4 times daily as directed., Disp: 150 g, Rfl: 2   meloxicam (MOBIC) 15 MG tablet, Take 1 tablet by mouth daily., Disp: 30 tablet, Rfl: 0   tadalafil (CIALIS) 20 MG tablet, Take 0.5-1 tablets (10-20 mg total) by mouth every other day as needed for erectile dysfunction., Disp: 10 tablet, Rfl: 11   triamcinolone (KENALOG) 0.025 % ointment, Apply 1 Application topically 2 (two) times daily., Disp: 30 g, Rfl: 0   atorvastatin (LIPITOR) 40 MG tablet, TAKE 1 TABLET (40 MG TOTAL) BY MOUTH DAILY., Disp: 90 tablet, Rfl: 3   cholecalciferol (VITAMIN D3) 25 MCG (1000 UNIT) tablet,  Take 1 tablet by mouth daily., Disp: 90 tablet, Rfl: 3   Objective:     BP 130/78 (BP Location: Right Arm, Patient Position: Sitting, Cuff Size: Large)   Pulse 91   Temp (!) 97.5 F (36.4 C) (Temporal)   Ht '6\' 3"'$  (1.905 m)   Wt 295 lb (133.8 kg)   SpO2 95%   BMI 36.87 kg/m  Wt Readings from Last 3 Encounters:  09/19/22 295 lb (133.8 kg)  09/29/21 280 lb (127 kg)  08/03/21 297 lb 3.2 oz (134.8 kg)      Physical Exam Constitutional:      General: He is not in acute distress.    Appearance: Normal appearance. He is not ill-appearing, toxic-appearing or diaphoretic.  HENT:     Head: Normocephalic and atraumatic.     Right Ear: External ear normal.     Left Ear: External ear normal.     Mouth/Throat:     Mouth: Mucous membranes are moist.     Pharynx: Oropharynx is clear. No oropharyngeal exudate or posterior oropharyngeal erythema.  Eyes:     General: No scleral icterus.       Right eye: No discharge.        Left eye: No discharge.     Extraocular Movements: Extraocular movements intact.     Conjunctiva/sclera: Conjunctivae normal.     Pupils: Pupils are equal, round, and reactive to light.  Cardiovascular:  Rate and Rhythm: Normal rate and regular rhythm.  Pulmonary:     Effort: Pulmonary effort is normal. No respiratory distress.     Breath sounds: Normal breath sounds.  Musculoskeletal:     Cervical back: No rigidity or tenderness.  Skin:    General: Skin is warm and dry.  Neurological:     Mental Status: He is alert and oriented to person, place, and time.  Psychiatric:        Mood and Affect: Mood normal.        Behavior: Behavior normal.      No results found for any visits on 09/19/22.    The 10-year ASCVD risk score (Arnett DK, et al., 2019) is: 5.9%    Assessment & Plan:   Prediabetes -     Comprehensive metabolic panel; Future -     Hemoglobin A1c; Future  Elevated LDL cholesterol level -     Atorvastatin Calcium; TAKE 1 TABLET (40 MG  TOTAL) BY MOUTH DAILY.  Dispense: 90 tablet; Refill: 3 -     Comprehensive metabolic panel; Future -     Lipid panel; Future  At risk for heart disease  Vitamin D deficiency -     Vitamin D; Take 1 tablet by mouth daily.  Dispense: 90 tablet; Refill: 3 -     VITAMIN D 25 Hydroxy (Vit-D Deficiency, Fractures); Future  Erectile dysfunction due to arterial insufficiency -     Tadalafil; Take 0.5-1 tablets (10-20 mg total) by mouth every other day as needed for erectile dysfunction.  Dispense: 10 tablet; Refill: 11    Return Return fasting for above ordered blood work.  Return in May for physical..  Discussed starting metformin for prediabetes.  Continue exercising, healthy eating and weight loss efforts.  Restart atorvastatin.  Libby Maw, MD

## 2022-09-21 ENCOUNTER — Other Ambulatory Visit (HOSPITAL_COMMUNITY): Payer: Self-pay

## 2022-11-16 ENCOUNTER — Other Ambulatory Visit (HOSPITAL_COMMUNITY): Payer: Self-pay

## 2022-12-15 ENCOUNTER — Other Ambulatory Visit: Payer: Self-pay

## 2022-12-17 ENCOUNTER — Other Ambulatory Visit (HOSPITAL_COMMUNITY): Payer: Self-pay

## 2022-12-19 ENCOUNTER — Other Ambulatory Visit: Payer: Self-pay

## 2022-12-21 ENCOUNTER — Other Ambulatory Visit (HOSPITAL_COMMUNITY): Payer: Self-pay

## 2022-12-22 ENCOUNTER — Other Ambulatory Visit (HOSPITAL_COMMUNITY): Payer: Self-pay

## 2022-12-27 ENCOUNTER — Other Ambulatory Visit (HOSPITAL_COMMUNITY): Payer: Self-pay

## 2022-12-28 ENCOUNTER — Other Ambulatory Visit (HOSPITAL_COMMUNITY): Payer: Self-pay

## 2023-01-30 ENCOUNTER — Ambulatory Visit (INDEPENDENT_AMBULATORY_CARE_PROVIDER_SITE_OTHER): Payer: Commercial Managed Care - PPO | Admitting: Family Medicine

## 2023-01-30 ENCOUNTER — Encounter: Payer: Self-pay | Admitting: Family Medicine

## 2023-01-30 VITALS — BP 136/84 | HR 85 | Temp 97.9°F | Ht 75.0 in | Wt 283.0 lb

## 2023-01-30 DIAGNOSIS — Z0000 Encounter for general adult medical examination without abnormal findings: Secondary | ICD-10-CM

## 2023-01-30 DIAGNOSIS — E7800 Pure hypercholesterolemia, unspecified: Secondary | ICD-10-CM | POA: Diagnosis not present

## 2023-01-30 DIAGNOSIS — Z23 Encounter for immunization: Secondary | ICD-10-CM

## 2023-01-30 DIAGNOSIS — E559 Vitamin D deficiency, unspecified: Secondary | ICD-10-CM

## 2023-01-30 DIAGNOSIS — R7303 Prediabetes: Secondary | ICD-10-CM

## 2023-01-30 LAB — COMPREHENSIVE METABOLIC PANEL
ALT: 29 U/L (ref 0–53)
AST: 21 U/L (ref 0–37)
Albumin: 4.1 g/dL (ref 3.5–5.2)
Alkaline Phosphatase: 54 U/L (ref 39–117)
BUN: 19 mg/dL (ref 6–23)
CO2: 25 mEq/L (ref 19–32)
Calcium: 9.2 mg/dL (ref 8.4–10.5)
Chloride: 106 mEq/L (ref 96–112)
Creatinine, Ser: 1.25 mg/dL (ref 0.40–1.50)
GFR: 65.99 mL/min (ref >60.00)
Glucose, Bld: 91 mg/dL (ref 70–99)
Potassium: 4.3 mEq/L (ref 3.5–5.1)
Sodium: 140 mEq/L (ref 135–145)
Total Bilirubin: 0.4 mg/dL (ref 0.2–1.2)
Total Protein: 6.9 g/dL (ref 6.0–8.3)

## 2023-01-30 LAB — CBC
HCT: 43.5 % (ref 39.0–52.0)
Hemoglobin: 14.6 g/dL (ref 13.0–17.0)
MCHC: 33.6 g/dL (ref 30.0–36.0)
MCV: 88 fl (ref 78.0–100.0)
Platelets: 226 10*3/uL (ref 150.0–400.0)
RBC: 4.95 Mil/uL (ref 4.22–5.81)
RDW: 14.5 % (ref 11.5–15.5)
WBC: 5 10*3/uL (ref 4.0–10.5)

## 2023-01-30 LAB — LIPID PANEL
Cholesterol: 141 mg/dL (ref 0–200)
HDL: 48.2 mg/dL (ref >39.00)
LDL Cholesterol: 81 mg/dL (ref 0–99)
NonHDL: 92.87
Total CHOL/HDL Ratio: 3
Triglycerides: 61 mg/dL (ref 0.0–149.0)
VLDL: 12.2 mg/dL (ref 0.0–40.0)

## 2023-01-30 LAB — URINALYSIS, ROUTINE W REFLEX MICROSCOPIC
Bilirubin Urine: NEGATIVE
Ketones, ur: NEGATIVE
Leukocytes,Ua: NEGATIVE
Nitrite: NEGATIVE
Specific Gravity, Urine: 1.025 (ref 1.000–1.030)
Total Protein, Urine: NEGATIVE
Urine Glucose: NEGATIVE
Urobilinogen, UA: 0.2 (ref 0.0–1.0)
pH: 6 (ref 5.0–8.0)

## 2023-01-30 LAB — HEMOGLOBIN A1C: Hgb A1c MFr Bld: 6 % (ref 4.6–6.5)

## 2023-01-30 LAB — MICROALBUMIN / CREATININE URINE RATIO
Creatinine,U: 210.4 mg/dL
Microalb Creat Ratio: 0.3 mg/g (ref 0.0–30.0)
Microalb, Ur: <0.7 mg/dL (ref 0.0–1.9)

## 2023-01-30 NOTE — Progress Notes (Signed)
Established Patient Office Visit   Subjective:  Patient ID: Steven Gillespie, male    DOB: [birth year]  Age: 52 y.o. MRN: [id number]  Chief Complaint  Patient presents with   Annual Exam    CPE, no concerns. Patient fasting.     HPI Encounter Diagnoses  Name Primary?   Elevated LDL cholesterol level Yes   Prediabetes    Vitamin D deficiency    Immunization due    Healthcare maintenance    Doing well.  Working hard.  Exercising by riding his bike a few days a week.  Regular dental care.  Lives at home with his wife and 2 sons ages 57 and 42.  Busy household.  Status post trip of a lifetime touring [place] seeing his sister.  Has been able to lose 13 pounds.  Experiences occasional fatigue.  Normal TSH a few years ago.   Review of Systems  Constitutional: Negative.   HENT: Negative.    Eyes:  Negative for blurred vision, discharge and redness.  Respiratory: Negative.    Cardiovascular: Negative.   Gastrointestinal:  Negative for abdominal pain.  Genitourinary: Negative.   Musculoskeletal: Negative.  Negative for myalgias.  Skin:  Negative for rash.  Neurological:  Negative for tingling, loss of consciousness and weakness.  Endo/Heme/Allergies:  Negative for polydipsia.     Current Outpatient Medications:    atorvastatin (LIPITOR) 40 MG tablet, TAKE 1 TABLET (40 MG TOTAL) BY MOUTH DAILY., Disp: 90 tablet, Rfl: 3   cholecalciferol (VITAMIN D3) 25 MCG (1000 UNIT) tablet, Take 1 tablet by mouth daily., Disp: 90 tablet, Rfl: 3   diclofenac Sodium (VOLTAREN) 1 % GEL, Apply a small grape sized dollop to tender spot up to 4 times daily as directed., Disp: 150 g, Rfl: 2   meloxicam (MOBIC) 15 MG tablet, Take 1 tablet by mouth daily., Disp: 30 tablet, Rfl: 0   tadalafil (CIALIS) 20 MG tablet, Take 1/2 - 1 tablet (10 - 20 mg total) by mouth every other day as needed for erectile dysfunction., Disp: 10 tablet, Rfl: 11   Objective:     BP 136/84 (BP Location: Right Arm,  Patient Position: Sitting, Cuff Size: Large)   Pulse 85   Temp 97.9 F (36.6 C) (Temporal)   Ht 6\' 3"  (1.905 m)   Wt 283 lb (128.4 kg)   SpO2 95%   BMI 35.37 kg/m  Wt Readings from Last 3 Encounters:  01/30/23 283 lb (128.4 kg)  09/19/22 295 lb (133.8 kg)  09/29/21 280 lb (127 kg)      Physical Exam   No results found for any visits on 01/30/23.    The 10-year ASCVD risk score (Arnett DK, et al., 2019) is: 6.4%    Assessment & Plan:   Elevated LDL cholesterol level -     Comprehensive metabolic panel -     Lipid panel  Prediabetes -     Comprehensive metabolic panel -     Hemoglobin A1c -     Urinalysis, Routine w reflex microscopic -     Microalbumin / creatinine urine ratio  Vitamin D deficiency -     VITAMIN D 25 Hydroxy (Vit-D Deficiency, Fractures)  Immunization due -     Tdap vaccine greater than or equal to 7yo IM -     Varicella-zoster vaccine IM  Healthcare maintenance -     CBC -     PSA    Return in about 1 year (around 01/30/2024), or if  symptoms worsen or fail to improve.  Encouraged 30 minutes of exercise for most days of the week.  Continue weight loss journey.  Advised that these could help his energy levels.  Shingrix and Tdap vaccinations today.  Information given on health maintenance and disease prevention.  Follow-up okay  Mliss Sax, MD

## 2023-01-31 LAB — PSA: PSA: 0.75 ng/mL (ref 0.10–4.00)

## 2023-01-31 LAB — VITAMIN D 25 HYDROXY (VIT D DEFICIENCY, FRACTURES): VITD: 31.38 ng/mL (ref 30.00–100.00)

## 2023-03-24 ENCOUNTER — Other Ambulatory Visit: Payer: Self-pay

## 2023-03-24 ENCOUNTER — Other Ambulatory Visit: Payer: Self-pay | Admitting: Family Medicine

## 2023-03-24 ENCOUNTER — Other Ambulatory Visit (HOSPITAL_COMMUNITY): Payer: Self-pay

## 2023-03-24 DIAGNOSIS — M25561 Pain in right knee: Secondary | ICD-10-CM

## 2023-03-24 MED ORDER — MELOXICAM 15 MG PO TABS
15.0000 mg | ORAL_TABLET | Freq: Every day | ORAL | 0 refills | Status: DC
  Filled 2023-03-24: qty 30, 30d supply, fill #0

## 2023-03-24 NOTE — Telephone Encounter (Signed)
Refill request for  Meloxicam 15 mg LR  08/12/22, #30, 0 rf LOV  01/30/23 FOV  08/06/23  Please review and advise.  Thanks. Dm/cma

## 2023-03-27 ENCOUNTER — Other Ambulatory Visit (HOSPITAL_COMMUNITY): Payer: Self-pay

## 2023-04-12 ENCOUNTER — Other Ambulatory Visit (HOSPITAL_COMMUNITY): Payer: Self-pay

## 2023-04-18 ENCOUNTER — Other Ambulatory Visit (HOSPITAL_COMMUNITY): Payer: Self-pay

## 2023-05-22 ENCOUNTER — Encounter (HOSPITAL_COMMUNITY): Payer: Self-pay

## 2023-05-26 ENCOUNTER — Other Ambulatory Visit (HOSPITAL_COMMUNITY): Payer: Self-pay

## 2023-05-30 DIAGNOSIS — H524 Presbyopia: Secondary | ICD-10-CM | POA: Diagnosis not present

## 2023-05-30 DIAGNOSIS — Z135 Encounter for screening for eye and ear disorders: Secondary | ICD-10-CM | POA: Diagnosis not present

## 2023-05-30 DIAGNOSIS — H52223 Regular astigmatism, bilateral: Secondary | ICD-10-CM | POA: Diagnosis not present

## 2023-05-30 DIAGNOSIS — H5203 Hypermetropia, bilateral: Secondary | ICD-10-CM | POA: Diagnosis not present

## 2023-06-08 ENCOUNTER — Other Ambulatory Visit (HOSPITAL_COMMUNITY): Payer: Self-pay

## 2023-07-17 ENCOUNTER — Other Ambulatory Visit: Payer: Self-pay | Admitting: Family Medicine

## 2023-07-17 ENCOUNTER — Other Ambulatory Visit (HOSPITAL_COMMUNITY): Payer: Self-pay

## 2023-07-17 DIAGNOSIS — M25561 Pain in right knee: Secondary | ICD-10-CM

## 2023-07-17 MED ORDER — DICLOFENAC SODIUM 1 % EX GEL
CUTANEOUS | 2 refills | Status: DC
  Filled 2023-07-17: qty 100, 7d supply, fill #0
  Filled 2023-08-24 – 2023-08-26 (×2): qty 100, 7d supply, fill #1
  Filled 2023-10-10: qty 100, 7d supply, fill #2
  Filled 2023-11-21: qty 100, 7d supply, fill #3
  Filled 2024-02-12 – 2024-06-26 (×3): qty 100, 7d supply, fill #4

## 2023-07-18 ENCOUNTER — Other Ambulatory Visit: Payer: Self-pay

## 2023-07-18 ENCOUNTER — Other Ambulatory Visit (HOSPITAL_COMMUNITY): Payer: Self-pay

## 2023-08-07 ENCOUNTER — Ambulatory Visit: Payer: Commercial Managed Care - PPO | Admitting: Family Medicine

## 2023-08-24 ENCOUNTER — Other Ambulatory Visit: Payer: Self-pay | Admitting: Family

## 2023-08-24 DIAGNOSIS — M25561 Pain in right knee: Secondary | ICD-10-CM

## 2023-08-26 ENCOUNTER — Other Ambulatory Visit (HOSPITAL_COMMUNITY): Payer: Self-pay

## 2023-08-28 ENCOUNTER — Ambulatory Visit: Payer: Commercial Managed Care - PPO | Admitting: Family Medicine

## 2023-08-29 ENCOUNTER — Other Ambulatory Visit (HOSPITAL_COMMUNITY): Payer: Self-pay

## 2023-09-08 IMAGING — DX DG KNEE COMPLETE 4+V*L*
4 series · 4 of 4 positions shown · non-contrast
Comparison: None.

CLINICAL DATA: Chronic medial joint pain, initial encounter

EXAM:
LEFT KNEE - COMPLETE 4+ VIEW

[knee ap]
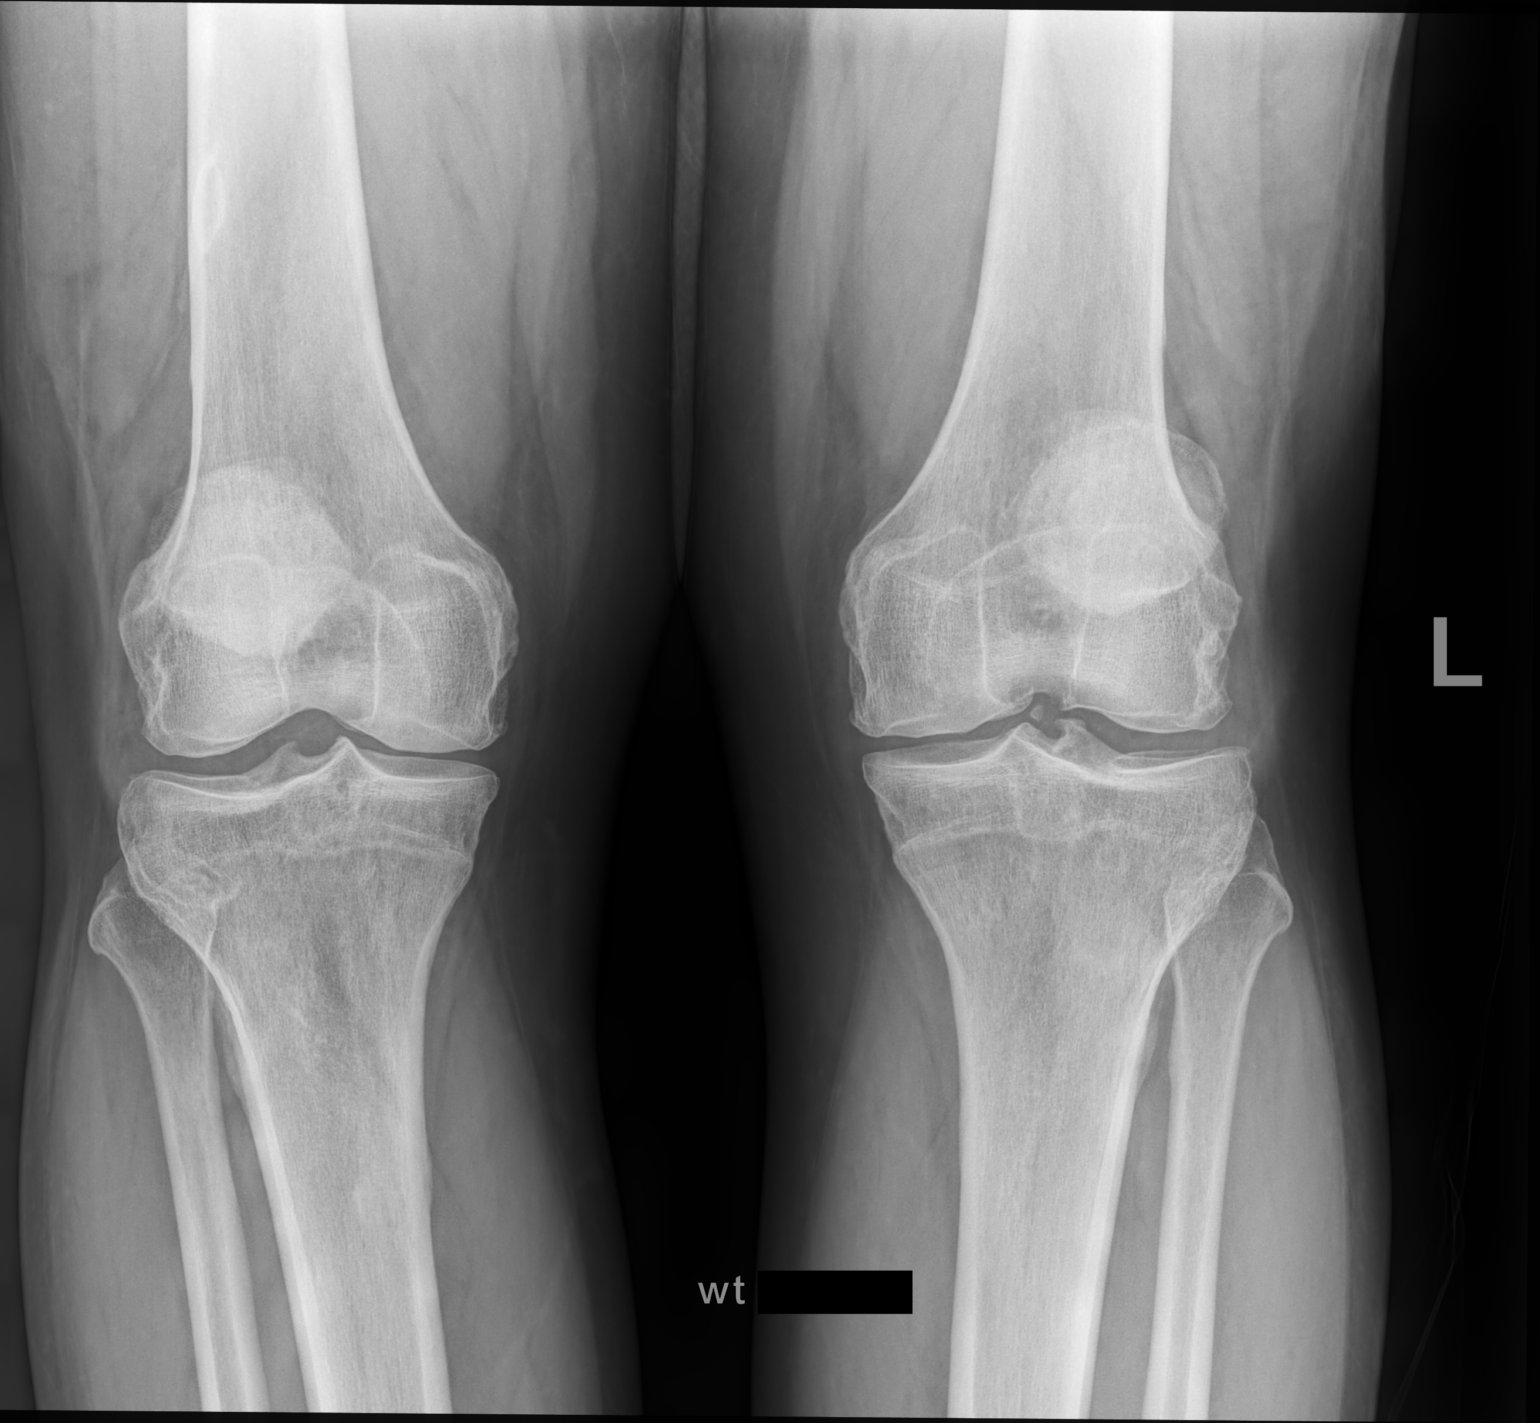

[knee [person_name] view pa]
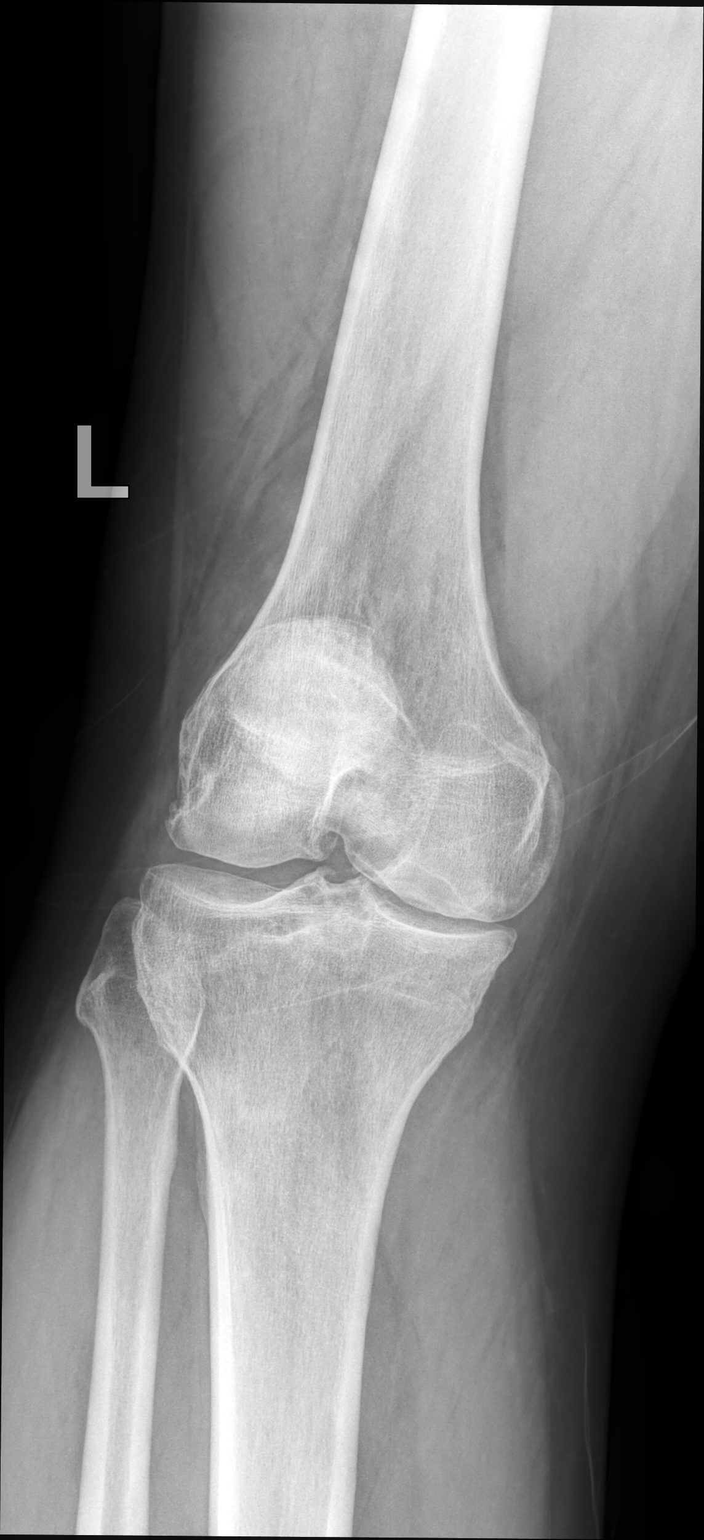

[knee lat]
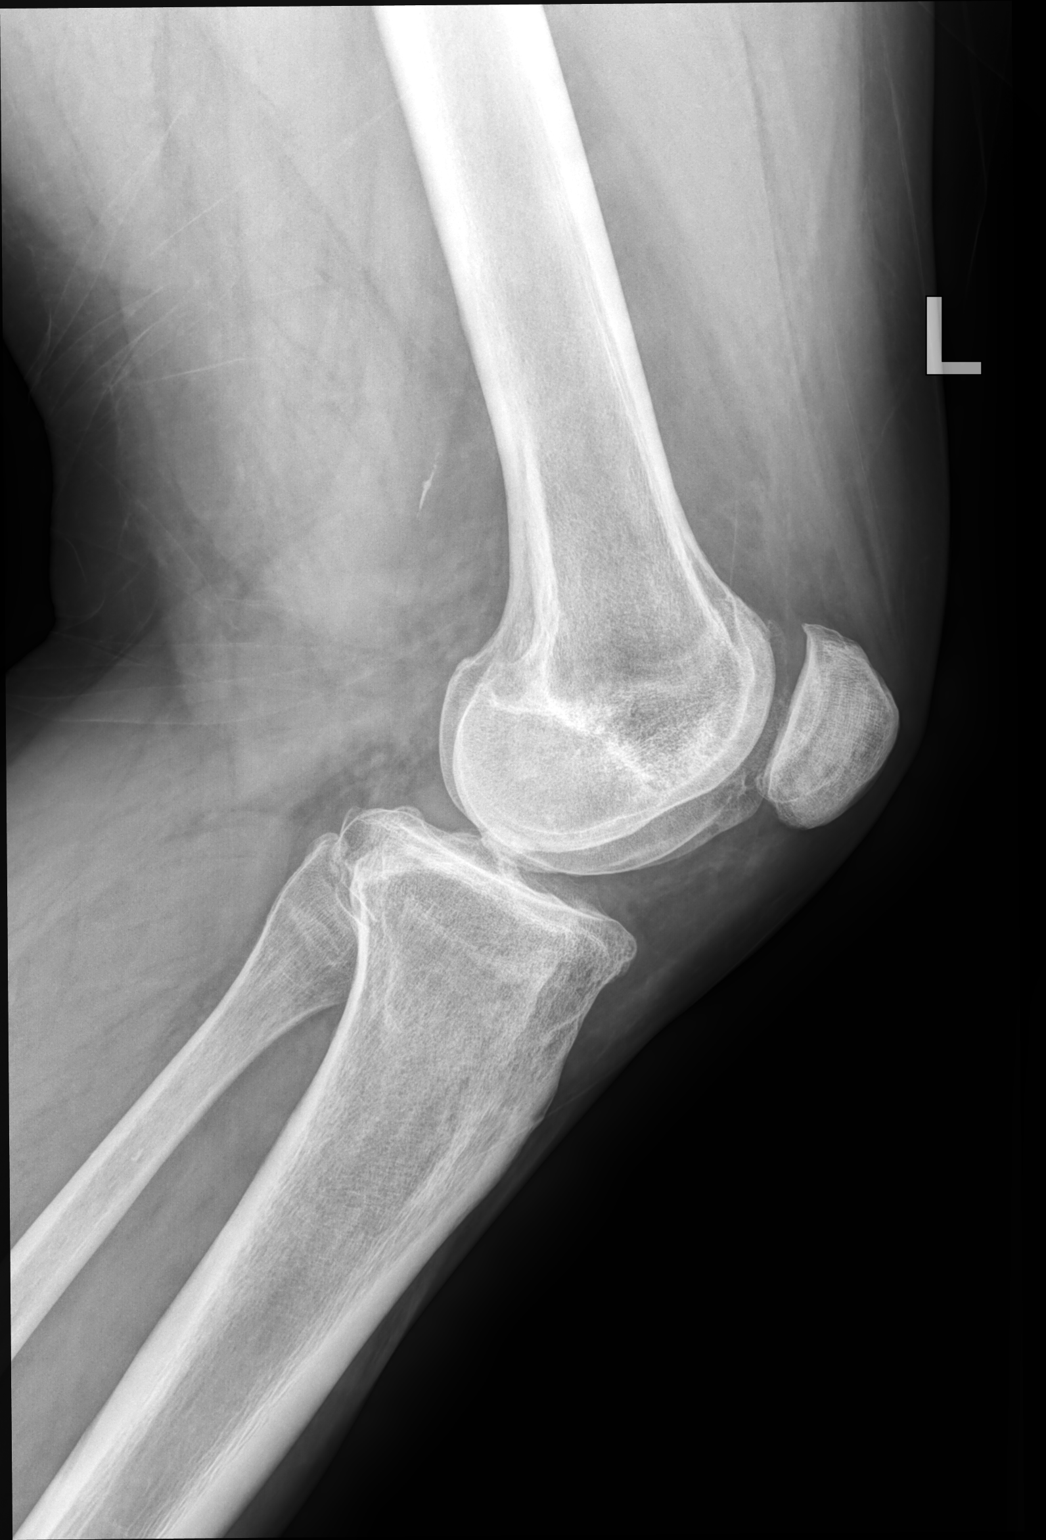

[patella (sunrise)]
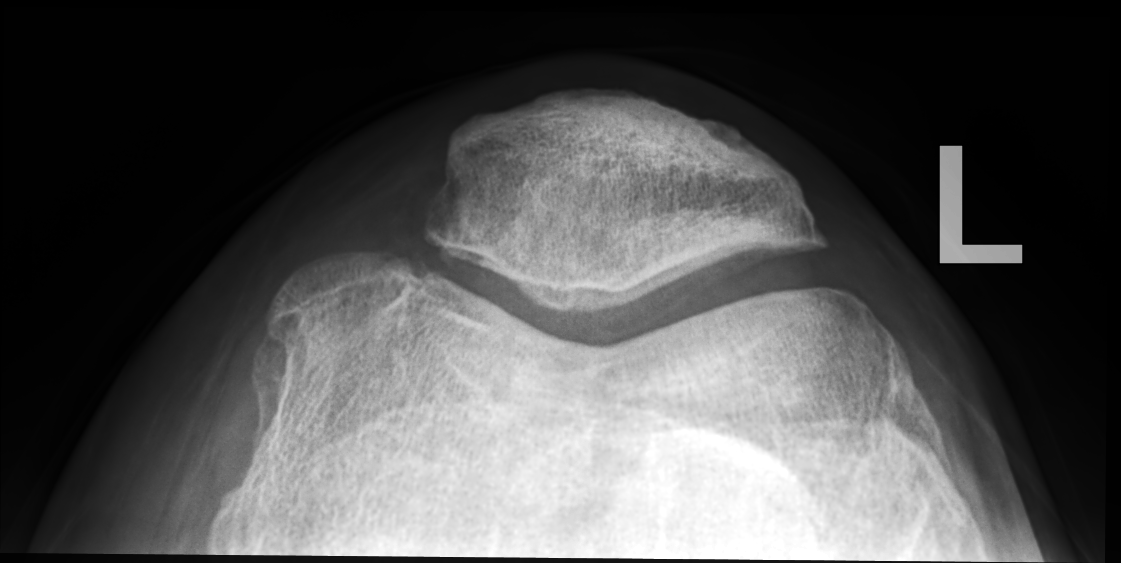

[4 of 4 positions shown; findings below may reference images not displayed]

FINDINGS: Tricompartmental degenerative changes are noted worst in the medial
joint space. No joint effusion is seen. No acute fracture or
dislocation is noted. No soft tissue abnormality is seen.
IMPRESSION: Degenerative change without acute abnormality.

## 2023-10-10 ENCOUNTER — Other Ambulatory Visit (HOSPITAL_COMMUNITY): Payer: Self-pay

## 2023-10-10 ENCOUNTER — Other Ambulatory Visit: Payer: Self-pay | Admitting: Family Medicine

## 2023-10-10 DIAGNOSIS — E7800 Pure hypercholesterolemia, unspecified: Secondary | ICD-10-CM

## 2023-10-10 DIAGNOSIS — N5201 Erectile dysfunction due to arterial insufficiency: Secondary | ICD-10-CM

## 2023-10-10 DIAGNOSIS — E559 Vitamin D deficiency, unspecified: Secondary | ICD-10-CM

## 2023-10-10 MED ORDER — VITAMIN D 25 MCG (1000 UNIT) PO TABS
1000.0000 [IU] | ORAL_TABLET | Freq: Every day | ORAL | 3 refills | Status: DC
  Filled 2023-10-10: qty 90, 90d supply, fill #0
  Filled 2023-11-21: qty 90, 90d supply, fill #1
  Filled 2024-02-12: qty 90, 90d supply, fill #2
  Filled 2024-06-26: qty 90, 90d supply, fill #3

## 2023-10-10 MED ORDER — TADALAFIL 20 MG PO TABS
10.0000 mg | ORAL_TABLET | ORAL | 11 refills | Status: AC | PRN
  Filled 2023-10-10: qty 10, 50d supply, fill #0
  Filled 2023-11-21: qty 10, 50d supply, fill #1
  Filled 2024-02-12: qty 10, 50d supply, fill #2
  Filled 2024-06-26: qty 10, 50d supply, fill #3
  Filled 2024-09-11: qty 10, 50d supply, fill #4
  Filled 2024-10-08: qty 10, 50d supply, fill #5

## 2023-10-10 MED ORDER — ATORVASTATIN CALCIUM 40 MG PO TABS
ORAL_TABLET | Freq: Every day | ORAL | 3 refills | Status: DC
  Filled 2023-10-10: qty 90, 90d supply, fill #0
  Filled 2023-11-21 – 2024-02-12 (×2): qty 90, 90d supply, fill #1
  Filled 2024-06-26: qty 90, 90d supply, fill #2
  Filled 2024-09-11: qty 90, 90d supply, fill #3

## 2023-10-10 NOTE — Telephone Encounter (Signed)
Refill requests for  Tadalifil 20 mg LR 09/19/22, #30, 1 rf  Vitamin D3  LR 09/19/22, #90, 3 rf  Atorvastatin  LR 09/19/22, #90, 3 rf LOV  01/30/23 FOV  none scheduled.    Please review and advise.  Thanks. Dm/cma

## 2023-10-12 ENCOUNTER — Other Ambulatory Visit (HOSPITAL_COMMUNITY): Payer: Self-pay

## 2023-11-02 ENCOUNTER — Other Ambulatory Visit (HOSPITAL_COMMUNITY): Payer: Self-pay

## 2023-11-21 ENCOUNTER — Other Ambulatory Visit (HOSPITAL_COMMUNITY): Payer: Self-pay

## 2023-11-21 ENCOUNTER — Other Ambulatory Visit: Payer: Self-pay

## 2023-12-20 ENCOUNTER — Telehealth: Admitting: Physician Assistant

## 2023-12-20 ENCOUNTER — Other Ambulatory Visit (HOSPITAL_COMMUNITY): Payer: Self-pay

## 2023-12-20 DIAGNOSIS — J302 Other seasonal allergic rhinitis: Secondary | ICD-10-CM | POA: Diagnosis not present

## 2023-12-20 MED ORDER — OLOPATADINE HCL 0.1 % OP SOLN
1.0000 [drp] | Freq: Two times a day (BID) | OPHTHALMIC | 0 refills | Status: DC
  Filled 2023-12-20: qty 5, 50d supply, fill #0

## 2023-12-20 NOTE — Progress Notes (Signed)
 E visit for Allergic Rhinitis We are sorry that you are not feeling well.  Here is how we plan to help!  Based on what you have shared with me it looks like you have Allergic Rhinitis.  Rhinitis is when a reaction occurs that causes nasal congestion, runny nose, sneezing, and itching.  Most types of rhinitis are caused by an inflammation and are associated with symptoms in the eyes ears or throat. There are several types of rhinitis.  The most common are acute rhinitis, which is usually caused by a viral illness, allergic or seasonal rhinitis, and nonallergic or year-round rhinitis.  Nasal allergies occur certain times of the year.  Allergic rhinitis is caused when allergens in the air trigger the release of histamine in the body.  Histamine causes itching, swelling, and fluid to build up in the fragile linings of the nasal passages, sinuses and eyelids.  An itchy nose and clear discharge are common.  I recommend the following over the counter treatments: You should take a daily dose of antihistamine, continue Zyrtec. If you have been on Zyrtec a long time you may need to change your antihistamine as you may have developed a tolerance. You can add a benadryl throughout the day or at bedtime as needed for severe allergy days.  I also would recommend a nasal spray: Continue Flonase 2 sprays into each nostril once daily. This can take up to 10-14 days to start lessening symptoms.  You may also benefit from eye drops such as: I have prescribed Olopatadine eye drops Use 1 drop in each eye twice daily as needed for itchy, watery eyes.  HOME CARE:  You can use an over-the-counter saline nasal spray as needed Avoid areas where there is heavy dust, mites, or molds Stay indoors on windy days during the pollen season Keep windows closed in home, at least in bedroom; use air conditioner. Use high-efficiency house air filter Keep windows closed in car, turn AC on re-circulate Avoid playing out with dog  during pollen season  GET HELP RIGHT AWAY IF:  If your symptoms do not improve within 10 days You become short of breath You develop yellow or green discharge from your nose for over 3 days You have coughing fits  MAKE SURE YOU:  Understand these instructions Will watch your condition Will get help right away if you are not doing well or get worse  Thank you for choosing an e-visit. Your e-visit answers were reviewed by a board certified advanced clinical practitioner to complete your personal care plan. Depending upon the condition, your plan could have included both over the counter or prescription medications. Please review your pharmacy choice. Be sure that the pharmacy you have chosen is open so that you can pick up your prescription now.  If there is a problem you may message your provider in MyChart to have the prescription routed to another pharmacy. Your safety is important to [place]. If you have drug allergies check your prescription carefully.  For the next 24 hours, you can use MyChart to ask questions about today's visit, request a non-urgent call back, or ask for a work or school excuse from your e-visit provider. You will get an email in the next two days asking about your experience. I hope that your e-visit has been valuable and will speed your recovery.       I have spent 5 minutes in review of e-visit questionnaire, review and updating patient chart, medical decision making and response to patient.  Margaretann Loveless, PA-C

## 2023-12-30 ENCOUNTER — Other Ambulatory Visit (HOSPITAL_COMMUNITY): Payer: Self-pay

## 2024-01-01 ENCOUNTER — Other Ambulatory Visit (HOSPITAL_COMMUNITY): Payer: Self-pay

## 2024-02-12 ENCOUNTER — Other Ambulatory Visit: Payer: Self-pay | Admitting: Family Medicine

## 2024-02-12 ENCOUNTER — Other Ambulatory Visit (HOSPITAL_COMMUNITY): Payer: Self-pay

## 2024-02-12 ENCOUNTER — Other Ambulatory Visit: Payer: Self-pay | Admitting: Family

## 2024-02-12 DIAGNOSIS — M25561 Pain in right knee: Secondary | ICD-10-CM

## 2024-02-16 ENCOUNTER — Other Ambulatory Visit (HOSPITAL_COMMUNITY): Payer: Self-pay

## 2024-03-07 ENCOUNTER — Other Ambulatory Visit: Payer: Self-pay | Admitting: Family

## 2024-03-07 ENCOUNTER — Other Ambulatory Visit (HOSPITAL_COMMUNITY): Payer: Self-pay

## 2024-03-07 DIAGNOSIS — M25561 Pain in right knee: Secondary | ICD-10-CM

## 2024-04-26 ENCOUNTER — Other Ambulatory Visit (HOSPITAL_COMMUNITY): Payer: Self-pay

## 2024-06-26 ENCOUNTER — Other Ambulatory Visit: Payer: Self-pay | Admitting: Family Medicine

## 2024-06-26 ENCOUNTER — Other Ambulatory Visit (HOSPITAL_COMMUNITY): Payer: Self-pay

## 2024-06-26 ENCOUNTER — Other Ambulatory Visit: Payer: Self-pay | Admitting: Family

## 2024-06-26 DIAGNOSIS — M25561 Pain in right knee: Secondary | ICD-10-CM

## 2024-06-27 ENCOUNTER — Encounter (HOSPITAL_COMMUNITY): Payer: Self-pay

## 2024-06-27 ENCOUNTER — Other Ambulatory Visit: Payer: Self-pay

## 2024-06-27 ENCOUNTER — Other Ambulatory Visit (HOSPITAL_COMMUNITY): Payer: Self-pay

## 2024-07-11 ENCOUNTER — Other Ambulatory Visit (HOSPITAL_COMMUNITY): Payer: Self-pay

## 2024-07-11 ENCOUNTER — Other Ambulatory Visit: Payer: Self-pay | Admitting: Family

## 2024-07-11 DIAGNOSIS — M25561 Pain in right knee: Secondary | ICD-10-CM

## 2024-08-20 ENCOUNTER — Other Ambulatory Visit: Payer: Self-pay

## 2024-08-20 ENCOUNTER — Other Ambulatory Visit (HOSPITAL_COMMUNITY): Payer: Self-pay

## 2024-08-20 MED ORDER — MELOXICAM 15 MG PO TABS
15.0000 mg | ORAL_TABLET | Freq: Every day | ORAL | 0 refills | Status: DC
  Filled 2024-08-20 – 2024-09-11 (×2): qty 30, 30d supply, fill #0

## 2024-08-20 NOTE — Telephone Encounter (Signed)
 Refill request received for Meloxicam  15mg  FOV:08/29/2024 LOV:01/30/2023 Last refill: 03/24/2023 Medication is pending your approval.

## 2024-08-29 ENCOUNTER — Encounter: Payer: Self-pay | Admitting: Family Medicine

## 2024-08-29 ENCOUNTER — Ambulatory Visit: Admitting: Family Medicine

## 2024-08-29 ENCOUNTER — Ambulatory Visit

## 2024-08-29 VITALS — BP 132/80 | HR 98 | Temp 97.7°F | Ht 75.0 in | Wt 298.0 lb

## 2024-08-29 DIAGNOSIS — Z6837 Body mass index (BMI) 37.0-37.9, adult: Secondary | ICD-10-CM

## 2024-08-29 DIAGNOSIS — Z23 Encounter for immunization: Secondary | ICD-10-CM | POA: Diagnosis not present

## 2024-08-29 DIAGNOSIS — Z125 Encounter for screening for malignant neoplasm of prostate: Secondary | ICD-10-CM | POA: Diagnosis not present

## 2024-08-29 DIAGNOSIS — E7800 Pure hypercholesterolemia, unspecified: Secondary | ICD-10-CM

## 2024-08-29 DIAGNOSIS — M25562 Pain in left knee: Secondary | ICD-10-CM

## 2024-08-29 DIAGNOSIS — R7303 Prediabetes: Secondary | ICD-10-CM | POA: Diagnosis not present

## 2024-08-29 DIAGNOSIS — Z0000 Encounter for general adult medical examination without abnormal findings: Secondary | ICD-10-CM | POA: Diagnosis not present

## 2024-08-29 NOTE — Progress Notes (Signed)
 Established Patient Office Visit   Subjective:  Patient ID: Steven Gillespie, male    DOB: [birth year]  Age: 53 y.o. MRN: [id number]  Chief Complaint  Patient presents with   Annual Exam    FASTING     HPI Encounter Diagnoses  Name Primary?   Healthcare maintenance Yes   Immunization due    Elevated LDL cholesterol level    Left knee pain, unspecified chronicity    Screening for prostate cancer    BMI 37.0-37.9, adult    Prediabetes    Follow-up for above.  Continues to live at home with his wife and 2 children who are now seniors and sophomore is in high high school.  He does have regular dental care.  Experiences left knee pain from time to time.  Uses meloxicam  on occasion.  Uses Voltaren  gel on occasion.  Last x-ray was in 2022.  Continues atorvastatin  for elevated cholesterol.   Review of Systems  Constitutional: Negative.   HENT: Negative.    Eyes:  Negative for blurred vision, discharge and redness.  Respiratory: Negative.    Cardiovascular: Negative.   Gastrointestinal:  Negative for abdominal pain.  Genitourinary: Negative.   Musculoskeletal: Negative.  Negative for myalgias.  Skin:  Negative for rash.  Neurological:  Negative for tingling, loss of consciousness and weakness.  Endo/Heme/Allergies:  Negative for polydipsia.      08/29/2024    2:52 PM 01/30/2023    9:22 AM 01/30/2023    8:39 AM  Depression screen PHQ 2/9  Decreased Interest 0 0 0  Down, Depressed, Hopeless 0 0 0  PHQ - 2 Score 0 0 0  Altered sleeping 0 0   Tired, decreased energy 0 1   Change in appetite 0 0   Feeling bad or failure about yourself  0 0   Trouble concentrating 0 0   Moving slowly or fidgety/restless 0 0   Suicidal thoughts 0 0   PHQ-9 Score 0 1    Difficult doing work/chores  Not difficult at all      Data saved with a previous flowsheet row definition     Current Medications[1]   Objective:     BP 132/80 (BP Location: Left Arm, Patient Position: Sitting, Cuff  Size: Large)   Pulse 98   Temp 97.7 F (36.5 C) (Oral)   Ht 6' 3 (1.905 m)   Wt 298 lb (135.2 kg)   SpO2 97%   BMI 37.25 kg/m  Wt Readings from Last 3 Encounters:  08/29/24 298 lb (135.2 kg)  01/30/23 283 lb (128.4 kg)  09/19/22 295 lb (133.8 kg)      Physical Exam Constitutional:      General: He is not in acute distress.    Appearance: Normal appearance. He is not ill-appearing, toxic-appearing or diaphoretic.  HENT:     Head: Normocephalic and atraumatic.     Right Ear: External ear normal.     Left Ear: External ear normal.     Mouth/Throat:     Mouth: Mucous membranes are moist.     Pharynx: Oropharynx is clear. No oropharyngeal exudate or posterior oropharyngeal erythema.  Eyes:     General: No scleral icterus.       Right eye: No discharge.        Left eye: No discharge.     Extraocular Movements: Extraocular movements intact.     Conjunctiva/sclera: Conjunctivae normal.     Pupils: Pupils are equal, round, and reactive to light.  Cardiovascular:     Rate and Rhythm: Normal rate and regular rhythm.  Pulmonary:     Effort: Pulmonary effort is normal. No respiratory distress.     Breath sounds: Normal breath sounds.  Abdominal:     General: Bowel sounds are normal.     Tenderness: There is no abdominal tenderness. There is no guarding.  Genitourinary:    Comments: Patient declines exam today. Musculoskeletal:     Cervical back: No rigidity or tenderness.  Skin:    General: Skin is warm and dry.  Neurological:     Mental Status: He is alert and oriented to person, place, and time.  Psychiatric:        Mood and Affect: Mood normal.        Behavior: Behavior normal.      No results found for any visits on 08/29/24.    The 10-year ASCVD risk score (Arnett DK, et al., 2019) is: 6.2%    Assessment & Plan:   Healthcare maintenance -     CBC with Differential/Platelet -     Urinalysis, Routine w reflex microscopic  Immunization due -      Varicella-zoster vaccine IM  Elevated LDL cholesterol level -     Comprehensive metabolic panel with GFR -     Lipid panel  Left knee pain, unspecified chronicity -     DG Knee 1-2 Views Left; Future  Screening for prostate cancer -     PSA  BMI 37.0-37.9, adult -     Amb Ref to Medical Weight Management  Prediabetes -     Comprehensive metabolic panel with GFR -     Hemoglobin A1c    Return in about 1 year (around 08/29/2025), or if symptoms worsen or fail to improve.  Information was given on health maintenance and disease prevention.  Patient feels as though he is ready to do something about his weight.  He would be interested in medical weight loss management.  He understands that his knee pain and prediabetes are related.  Second Shingrix  vaccine today.  Elsie Sim Lent, MD    [1]  Current Outpatient Medications:    atorvastatin  (LIPITOR) 40 MG tablet, TAKE 1 TABLET (40 MG TOTAL) BY MOUTH DAILY., Disp: 90 tablet, Rfl: 3   cholecalciferol  (VITAMIN D3) 25 MCG (1000 UNIT) tablet, Take 1 tablet by mouth daily., Disp: 90 tablet, Rfl: 3   meloxicam  (MOBIC ) 15 MG tablet, Take 1 tablet by mouth daily. (Patient taking differently: Take 15 mg by mouth as needed for pain.), Disp: 30 tablet, Rfl: 0   diclofenac  Sodium (VOLTAREN ) 1 % GEL, Apply a small grape sized dollop to tender spot up to 4 times daily as directed., Disp: 150 g, Rfl: 2   tadalafil  (CIALIS ) 20 MG tablet, Take 1/2 - 1 tablet (10 - 20 mg total) by mouth every other day as needed for erectile dysfunction., Disp: 10 tablet, Rfl: 11

## 2024-08-30 ENCOUNTER — Ambulatory Visit: Payer: Self-pay | Admitting: Family Medicine

## 2024-08-30 LAB — CBC WITH DIFFERENTIAL/PLATELET
Basophils Absolute: 0.1 K/uL (ref 0.0–0.1)
Basophils Relative: 1 % (ref 0.0–3.0)
Eosinophils Absolute: 0 K/uL (ref 0.0–0.7)
Eosinophils Relative: 0.7 % (ref 0.0–5.0)
HCT: 42.5 % (ref 39.0–52.0)
Hemoglobin: 14.5 g/dL (ref 13.0–17.0)
Lymphocytes Relative: 49.1 % — ABNORMAL HIGH (ref 12.0–46.0)
Lymphs Abs: 3.1 K/uL (ref 0.7–4.0)
MCHC: 34 g/dL (ref 30.0–36.0)
MCV: 88.5 fl (ref 78.0–100.0)
Monocytes Absolute: 0.6 K/uL (ref 0.1–1.0)
Monocytes Relative: 9.5 % (ref 3.0–12.0)
Neutro Abs: 2.5 K/uL (ref 1.4–7.7)
Neutrophils Relative %: 39.7 % — ABNORMAL LOW (ref 43.0–77.0)
Platelets: 231 K/uL (ref 150.0–400.0)
RBC: 4.81 Mil/uL (ref 4.22–5.81)
RDW: 15.1 % (ref 11.5–15.5)
WBC: 6.4 K/uL (ref 4.0–10.5)

## 2024-08-30 LAB — COMPREHENSIVE METABOLIC PANEL WITH GFR
ALT: 31 U/L (ref 3–53)
AST: 25 U/L (ref 5–37)
Albumin: 4.6 g/dL (ref 3.5–5.2)
Alkaline Phosphatase: 51 U/L (ref 39–117)
BUN: 21 mg/dL (ref 6–23)
CO2: 28 meq/L (ref 19–32)
Calcium: 9.9 mg/dL (ref 8.4–10.5)
Chloride: 104 meq/L (ref 96–112)
Creatinine, Ser: 1.23 mg/dL (ref 0.40–1.50)
GFR: 66.54 mL/min
Glucose, Bld: 89 mg/dL (ref 70–99)
Potassium: 4 meq/L (ref 3.5–5.1)
Sodium: 140 meq/L (ref 135–145)
Total Bilirubin: 0.4 mg/dL (ref 0.2–1.2)
Total Protein: 7.4 g/dL (ref 6.0–8.3)

## 2024-08-30 LAB — URINALYSIS, ROUTINE W REFLEX MICROSCOPIC
Bilirubin Urine: NEGATIVE
Leukocytes,Ua: NEGATIVE
Nitrite: NEGATIVE
Specific Gravity, Urine: 1.025 (ref 1.000–1.030)
Total Protein, Urine: NEGATIVE
Urine Glucose: NEGATIVE
Urobilinogen, UA: 0.2 (ref 0.0–1.0)
pH: 6 (ref 5.0–8.0)

## 2024-08-30 LAB — LIPID PANEL
Cholesterol: 144 mg/dL (ref 28–200)
HDL: 49.7 mg/dL
LDL Cholesterol: 80 mg/dL (ref 10–99)
NonHDL: 94.41
Total CHOL/HDL Ratio: 3
Triglycerides: 73 mg/dL (ref 10.0–149.0)
VLDL: 14.6 mg/dL (ref 0.0–40.0)

## 2024-08-30 LAB — HEMOGLOBIN A1C: Hgb A1c MFr Bld: 6.3 % (ref 4.6–6.5)

## 2024-08-30 LAB — PSA: PSA: 0.97 ng/mL (ref 0.10–4.00)

## 2024-09-03 NOTE — Telephone Encounter (Signed)
 Copied from CRM [id number]. Topic: Clinical - Lab/Test Results >> Sep 03, 2024 11:07 AM Revonda D wrote: Reason for CRM: Pt is returning a missed call from Keevon Henney in regards to lab results. Pt was informed of lab results from Dr.Kremer.

## 2024-09-07 ENCOUNTER — Other Ambulatory Visit (HOSPITAL_COMMUNITY): Payer: Self-pay

## 2024-09-09 ENCOUNTER — Other Ambulatory Visit (HOSPITAL_COMMUNITY): Payer: Self-pay

## 2024-09-11 ENCOUNTER — Encounter (INDEPENDENT_AMBULATORY_CARE_PROVIDER_SITE_OTHER): Payer: Self-pay

## 2024-09-11 ENCOUNTER — Other Ambulatory Visit: Payer: Self-pay | Admitting: Family Medicine

## 2024-09-11 ENCOUNTER — Other Ambulatory Visit: Payer: Self-pay

## 2024-09-11 ENCOUNTER — Other Ambulatory Visit (HOSPITAL_COMMUNITY): Payer: Self-pay

## 2024-09-11 DIAGNOSIS — E559 Vitamin D deficiency, unspecified: Secondary | ICD-10-CM

## 2024-09-11 DIAGNOSIS — M25561 Pain in right knee: Secondary | ICD-10-CM

## 2024-09-13 ENCOUNTER — Other Ambulatory Visit (HOSPITAL_COMMUNITY): Payer: Self-pay

## 2024-09-13 ENCOUNTER — Other Ambulatory Visit: Payer: Self-pay

## 2024-09-13 MED ORDER — DICLOFENAC SODIUM 1 % EX GEL
CUTANEOUS | 2 refills | Status: AC
  Filled 2024-09-13: qty 200, 30d supply, fill #0
  Filled 2024-10-17: qty 200, 30d supply, fill #1

## 2024-09-13 MED ORDER — VITAMIN D 25 MCG (1000 UNIT) PO TABS
1000.0000 [IU] | ORAL_TABLET | Freq: Every day | ORAL | 3 refills | Status: DC
  Filled 2024-09-13: qty 90, 90d supply, fill #0

## 2024-09-13 NOTE — Telephone Encounter (Signed)
 Refill request for  Vitamin d  1000   LR  10/10/23 #30, 11 rfs  Diclofenac  Sodium LR  07/27/23 LOV  08/29/24 FOV  09/02/25  Please review and advise.  Thanks.  Dm/cma

## 2024-09-23 DIAGNOSIS — Z0289 Encounter for other administrative examinations: Secondary | ICD-10-CM

## 2024-09-26 ENCOUNTER — Encounter (INDEPENDENT_AMBULATORY_CARE_PROVIDER_SITE_OTHER): Payer: Self-pay | Admitting: Family Medicine

## 2024-09-26 ENCOUNTER — Ambulatory Visit (INDEPENDENT_AMBULATORY_CARE_PROVIDER_SITE_OTHER): Admitting: Family Medicine

## 2024-09-26 VITALS — BP 138/77 | HR 77 | Temp 97.9°F | Ht 74.0 in | Wt 292.0 lb

## 2024-09-26 DIAGNOSIS — R0602 Shortness of breath: Secondary | ICD-10-CM

## 2024-09-26 DIAGNOSIS — R7303 Prediabetes: Secondary | ICD-10-CM

## 2024-09-26 DIAGNOSIS — R5383 Other fatigue: Secondary | ICD-10-CM

## 2024-09-26 DIAGNOSIS — M179 Osteoarthritis of knee, unspecified: Secondary | ICD-10-CM

## 2024-09-26 DIAGNOSIS — E7849 Other hyperlipidemia: Secondary | ICD-10-CM

## 2024-09-26 DIAGNOSIS — E559 Vitamin D deficiency, unspecified: Secondary | ICD-10-CM | POA: Diagnosis not present

## 2024-09-26 DIAGNOSIS — Z6837 Body mass index (BMI) 37.0-37.9, adult: Secondary | ICD-10-CM | POA: Diagnosis not present

## 2024-09-26 DIAGNOSIS — Z1331 Encounter for screening for depression: Secondary | ICD-10-CM

## 2024-09-26 NOTE — Progress Notes (Signed)
 "  Steven Gillespie, D.O.  ABFM, ABOM Specializing in Clinical Bariatric Medicine Office located at: [address]  [place], [place]  [address]   Bariatric Medicine Visit  Dear Steven Gillespie, Steven Sayre, MD   Thank you for referring Steven Gillespie to our clinic today for evaluation.  We performed a consultation to discuss his options for treatment and educate the patient on his disease state.  The following note includes my evaluation and treatment recommendations.   Please do not hesitate to reach out to me directly if you have any further concerns.    Assessment and Plan:   Orders Placed This Encounter  Procedures   Insulin , random   Vitamin B12   VITAMIN D  25 Hydroxy (Vit-D Deficiency, Fractures)   TSH   T4, free   Magnesium   EKG 12-Lead    FOR THE DISEASE OF OBESITY:  BMI 37.0-37.9, adult-Start BMI 37.47 Assessment & Plan: Steven Gillespie is currently in the action stage of change. As such, his goal is to start our weight management plan.  He has agreed to implement: follow the Category 3 plan    Behavioral Intervention We discussed the following Behavioral Modification Strategies today: increasing lean protein intake to established goals, increasing water intake , work on meal planning and preparation, reading food labels , keeping healthy foods at home, planning for success, and better snacking choices  Additional resources provided today: Handout on CAT 3 meal plan, Handout on CAT 3-4 breakfast options, and Handout on CAT 3-4 lunch options  Evidence-based interventions for health behavior change were utilized today including the discussion of self monitoring techniques, problem-solving barriers and SMART goal setting techniques.    Goal(s) for next OV: Weigh lean proteins and measure veggies    Recommended Physical Activity Goals Steven Gillespie has been advised to work up to 300-450  minutes of moderate intensity aerobic activity a week and strengthening exercises 2-3  times per week for cardiovascular health, weight loss maintenance and preservation of muscle mass.   He has agreed to : maintain current level of activity.    Pharmacotherapy We discussed various medication options to help Steven Gillespie with his weight loss efforts and we both agreed to: Begin with nutritional and behavioral strategies   ASSOCIATED CONDITIONS ADDRESSED TODAY:  Fatigue Assessment & Plan: Steven Gillespie does feel that his weight is causing his energy to be lower than it should be. Fatigue may be related to obesity, depression or many other causes. he does not appear to have any red flag symptoms and this appears to most likely be related to his current lifestyle habits and dietary intake.  Labs will be ordered and reviewed with him at their next office visit in two weeks.  Epworth sleepiness scale is 3 and appears to be within normal limits. Steven Gillespie denies daytime somnolence and denies waking up still tired. Patient has a history of symptoms of morning fatigue. Steven Gillespie generally gets 8 hours of sleep per night, and states that he has generally restful sleep. Snoring is present. Apneic episodes is not present.   ECG: Performed and reviewed/ interpreted independently.  Normal sinus bradycardia rhythm, rate 56 bpm; reassuring without any acute abnormalities, will continue to monitor for symptoms     Shortness of breath on exertion Assessment & Plan: [name] does feel that he gets out of breath more easily than he used to when he exercises and seems to be worsening over time with weight gain.  This has gotten worse recently. Steven Gillespie denies shortness of breath  at rest or orthopnea. Pt denies chest pain, dizziness, heart palpitations, or excessive diaphoresis or nausea with activity.  This is not new and is ongoing.  Steven Gillespie's shortness of breath appears to be obesity related and exercise induced, as they do not appear to have any red flag symptoms/ concerns today.  Also, this condition  appears to be related to a state of poor cardiovascular conditioning   Obtain labs today and will be reviewed with him at their next office visit in two weeks.  Indirect Calorimeter completed today to help guide our dietary regimen. It shows a VO2 of 350 and a REE of 2419.  His calculated basal metabolic rate is 7349 thus his resting energy expenditure is worse than expected.  Patient agreed to work on weight loss at this time.  As [name] progresses through our weight loss program, we will gradually increase exercise as tolerated to treat his current condition.   If Steven Gillespie follows our recommendations and loses 5-10% of their weight without improvement of his shortness of breath or if at any time, symptoms become more concerning, they agree to urgently follow up with their PCP/ specialist for further consideration/ evaluation.   Steven Gillespie verbalizes agreement with this plan.     Depression Screen  Assessment & Plan: His PHQ-9 score was 0 today.    Prediabetes Assessment & Plan Lab Results  Component Value Date   HGBA1C 6.3 08/29/2024   HGBA1C 6.0 01/30/2023   HGBA1C 5.9 (H) 10/15/2020   INSULIN  22.6 10/15/2020   INSULIN  29.0 (H) 06/16/2020    Patient was previously seen at this clinic. He states that he has never been on any medication to help with his prediabetes. The last time he was here was in 2022. Will review patient labs at next OV. Begin following meal plan and decreasing simple carbs and sugars.     Vitamin D  deficiency Assessment & Plan Lab Results  Component Value Date   VD25OH 31.38 01/30/2023   VD25OH 32.9 10/15/2020   VD25OH 31.0 06/16/2020   Taking Vitamin D3 1000 units daily with reported good compliance and tolerance. Will obtain labs today and review at next OV. Patient states that he has no difficulty remembering to take the supplementation. Continue with supplementation.     Other hyperlipidemia Assessment & Plan Lab Results  Component Value Date    CHOL 144 08/29/2024   HDL 49.70 08/29/2024   LDLCALC 80 08/29/2024   LDLDIRECT 111.0 05/29/2020   TRIG 73.0 08/29/2024   CHOLHDL 3 08/29/2024    On Lipitor 40 mg daily with reported good compliance and tolerance. No acute concerns today. Will review patients labs with him at next OV. Continue with medication. Begin following nutritional meal plan and decreasing saturated and trans fat.      Osteoarthritis of knee, unspecified laterality, unspecified osteoarthritis type Assessment & Plan Patient states that he has left knee pain. He had surgery on his knee. He is taking meloxicam  1 tablet prn for the pain. Reminded patient that as he increases his exercise as tolerated and decreases his weight it will help with his joint pain. Continue following up with PCP/ specialist.      FOLLOW UP:   Follow up in 2 weeks. He was informed of the importance of frequent follow up visits to maximize his success with intensive lifestyle modifications for his multiple health conditions.  ELYON ZOLL is aware that we will review all of his lab results at our next visit.  He is aware  that if anything is critical/ life threatening with the results, we will be contacting him via MyChart prior to the office visit to discuss management.     Chief Complaint:   OBESITY Steven Gillespie (MR# [id number]) is a pleasant 54 y.o. male who presents for evaluation and treatment of obesity and related comorbidities. Current BMI is Body mass index is 37.49 kg/m. Steven Gillespie has been struggling with his weight for many years and has been unsuccessful in either losing weight, maintaining weight loss, or reaching his healthy weight goal.  Steven Gillespie is currently in the action stage of change and ready to dedicate time achieving and maintaining a healthier weight. Steven Gillespie is interested in becoming our patient and working on intensive lifestyle modifications including (but not limited to) diet and  exercise for weight loss.  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 Patient is married to [place] and has children. He lives with his children and spouse.  - Patient gets about 5K steps per day.   - No formal exercise   - Desires to be 235 lbs at the end of the year.   - Wishes to be that weight because it is the weight he was when he got married.   - Attributes his weight gain to his inactivity and after he turned 39.   - Heaviest weight was 293 lbs.  - No diets in the past.   - No weight loss surgery or medications in the past.  - Gets fast food or take out 5 times per week.   - Likes cooking.  - Does not crave.  - Not a picky eater.  - Snacks on protein bars and nuts.  - Skips breakfast daily.  - Drinks caloric beverages like: no regular soda, sweet tea with sugar, bourbon 2 times a week.  - Worst food habit is fried foods    Subjective:   This is the patient's first visit at [facility].  The patient's NEW PATIENT PACKET that they filled out prior to today's office visit was reviewed at length and information from that paperwork was included within the following office visit note.    Included in the packet: current and past health history, medications, allergies, ROS, gynecologic history (women only), surgical history, family history, social history, weight history, weight loss surgery history (for those that have had weight loss surgery), nutritional evaluation, mood and food questionnaire along with a depression screening (PHQ9) on all patients, an Epworth questionnaire, sleep habits questionnaire, patient life and health improvement goals questionnaire. These will all be scanned into the patient's chart under the media tab.   Review of Systems: Please refer to new patient packet scanned into media. Pertinent positives were addressed with patient today.  Reviewed by clinician on day of visit: allergies, medications,  problem list, medical history, surgical history, family history, social history, and previous encounter notes.  During the visit, I independently reviewed the patient's EKG, bioimpedance scale results, and indirect calorimeter results. I used this information to tailor a meal plan for the patient that will help Steven Gillespie to lose weight and will improve his obesity-related conditions going forward.  I performed a medically necessary appropriate examination and/or evaluation. I discussed the assessment and treatment plan with the patient. The patient was provided an opportunity to ask questions and all were answered. The patient agreed with the plan and demonstrated an understanding of the instructions. Labs were ordered  today (unless patient declined them) and will be reviewed with the patient at our next visit unless more critical results need to be addressed immediately. Clinical information was updated and documented in the EMR.    Objective:   PHYSICAL EXAM: Blood pressure 138/77, pulse 77, temperature 97.9 F (36.6 C), height 6' 2 (1.88 m), weight 292 lb (132.5 kg), SpO2 96%. Body mass index is 37.49 kg/m.  General: Well Developed, well nourished, and in no acute distress.  HEENT: Normocephalic, atraumatic; EOMI, sclerae are anicteric. Skin: Warm and dry, good turgor Chest:  Normal excursion, shape, no gross ABN Respiratory: No conversational dyspnea; speaking in full sentences NeuroM-Sk:  Normal gross ROM * 4 extremities  Psych: A and O *3, insight adequate, mood- full   Anthropometric Measurements Height: 6' 2 (1.88 m) Weight: 292 lb (132.5 kg) BMI (Calculated): 37.47 Weight at Last Visit: 0 Weight Lost Since Last Visit: 0 Weight Gained Since Last Visit: 0 Starting Weight: 292lb Total Weight Loss (lbs): 0 lb (0 kg) Peak Weight: 292lb Waist Measurement : 48 inches   Body Composition  Body Fat %: 34.6 % Fat Mass (lbs): 101.2 lbs Muscle Mass (lbs): 181.6 lbs Total  Body Water (lbs): 128.6 lbs Visceral Fat Rating : 20   Other Clinical Data RMR: 2419 Fasting: yes Labs: yes Today's Visit #: 1 Starting Date: 09/26/24 Comments: New Patient    DIAGNOSTIC DATA REVIEWED:  BMET    Component Value Date/Time   NA 140 08/29/2024 1538   NA 142 10/15/2020 0823   K 4.0 08/29/2024 1538   CL 104 08/29/2024 1538   CO2 28 08/29/2024 1538   GLUCOSE 89 08/29/2024 1538   BUN 21 08/29/2024 1538   BUN 22 10/15/2020 0823   CREATININE 1.23 08/29/2024 1538   CREATININE 1.26 04/03/2012 1017   CALCIUM  9.9 08/29/2024 1538   GFRNONAA 67 10/15/2020 0823   GFRAA 77 10/15/2020 0823   Lab Results  Component Value Date   HGBA1C 6.3 08/29/2024   HGBA1C 5.8 (H) 06/16/2020   Lab Results  Component Value Date   INSULIN  22.6 10/15/2020   INSULIN  29.0 (H) 06/16/2020   Lab Results  Component Value Date   TSH 2.39 05/29/2020   CBC    Component Value Date/Time   WBC 6.4 08/29/2024 1538   RBC 4.81 08/29/2024 1538   HGB 14.5 08/29/2024 1538   HCT 42.5 08/29/2024 1538   PLT 231.0 08/29/2024 1538   MCV 88.5 08/29/2024 1538   MCH 29.7 04/03/2012 1017   MCHC 34.0 08/29/2024 1538   RDW 15.1 08/29/2024 1538   Iron Studies No results found for: IRON, TIBC, FERRITIN, IRONPCTSAT Lipid Panel     Component Value Date/Time   CHOL 144 08/29/2024 1538   CHOL 173 10/15/2020 0823   TRIG 73.0 08/29/2024 1538   HDL 49.70 08/29/2024 1538   HDL 46 10/15/2020 0823   CHOLHDL 3 08/29/2024 1538   VLDL 14.6 08/29/2024 1538   LDLCALC 80 08/29/2024 1538   LDLCALC 114 (H) 10/15/2020 0823   LDLDIRECT 111.0 05/29/2020 1127   Hepatic Function Panel     Component Value Date/Time   PROT 7.4 08/29/2024 1538   PROT 7.3 10/15/2020 0823   ALBUMIN 4.6 08/29/2024 1538   ALBUMIN 4.3 10/15/2020 0823   AST 25 08/29/2024 1538   ALT 31 08/29/2024 1538   ALKPHOS 51 08/29/2024 1538   BILITOT 0.4 08/29/2024 1538   BILITOT 0.3 10/15/2020 0823      Component Value Date/Time  TSH 2.39 05/29/2020 1127   Nutritional Lab Results  Component Value Date   VD25OH 31.38 01/30/2023   VD25OH 32.9 10/15/2020   VD25OH 31.0 06/16/2020    Attestation Statements:   LILLETTE Sonny Laroche, acting as a medical scribe for Steven Jenkins, DO., have compiled all relevant documentation for today's office visit on behalf of Steven Jenkins, DO, while in the presence of [employer], DO.  I have spent 42 minutes in the care of the patient today including: 30 minutes face-to-face assessing and reviewing listed medical problems above as outlined in office visit note, providing nutritional and behavioral counseling as outlined in obesity care plan, independently interpreting results and goals of care, see listed medical problems, and discussing biometric information and progress. 12 minutes was spent on review of chart/new patient packet and additional post-visit documentation.  I have reviewed the above documentation for accuracy and completeness, and I agree with the above. Steven Gillespie, D.O.  The 21st Century Cures Act was signed into law in 2016 which includes the topic of electronic health records.  This provides immediate access to information in MyChart.  This includes consultation notes, operative notes, office notes, lab results and pathology reports.  If you have any questions about what you read please let us  know at your next visit so we can discuss your concerns and take corrective action if need be.  We are right here with you. "

## 2024-09-27 LAB — T4, FREE: Free T4: 1.09 ng/dL (ref 0.82–1.77)

## 2024-09-27 LAB — INSULIN, RANDOM: INSULIN: 26.3 u[IU]/mL — ABNORMAL HIGH (ref 2.6–24.9)

## 2024-09-27 LAB — TSH: TSH: 2.3 u[IU]/mL (ref 0.450–4.500)

## 2024-09-27 LAB — MAGNESIUM: Magnesium: 2.3 mg/dL (ref 1.6–2.3)

## 2024-09-27 LAB — VITAMIN D 25 HYDROXY (VIT D DEFICIENCY, FRACTURES): Vit D, 25-Hydroxy: 35.5 ng/mL (ref 30.0–100.0)

## 2024-09-27 LAB — VITAMIN B12: Vitamin B-12: 394 pg/mL (ref 232–1245)

## 2024-10-08 ENCOUNTER — Other Ambulatory Visit (HOSPITAL_COMMUNITY): Payer: Self-pay

## 2024-10-10 ENCOUNTER — Ambulatory Visit (INDEPENDENT_AMBULATORY_CARE_PROVIDER_SITE_OTHER): Admitting: Family Medicine

## 2024-10-10 ENCOUNTER — Encounter (INDEPENDENT_AMBULATORY_CARE_PROVIDER_SITE_OTHER): Payer: Self-pay | Admitting: Family Medicine

## 2024-10-10 VITALS — BP 132/82 | HR 90 | Temp 98.1°F | Ht 74.0 in | Wt 290.0 lb

## 2024-10-10 DIAGNOSIS — E66812 Obesity, class 2: Secondary | ICD-10-CM

## 2024-10-10 DIAGNOSIS — Z6837 Body mass index (BMI) 37.0-37.9, adult: Secondary | ICD-10-CM | POA: Diagnosis not present

## 2024-10-10 DIAGNOSIS — E7849 Other hyperlipidemia: Secondary | ICD-10-CM

## 2024-10-10 DIAGNOSIS — R7303 Prediabetes: Secondary | ICD-10-CM | POA: Diagnosis not present

## 2024-10-10 DIAGNOSIS — E559 Vitamin D deficiency, unspecified: Secondary | ICD-10-CM | POA: Diagnosis not present

## 2024-10-10 MED ORDER — VITAMIN D 25 MCG (1000 UNIT) PO TABS: 2000.0000 [IU] | ORAL_TABLET | Freq: Every day | ORAL | Status: AC

## 2024-10-10 NOTE — Progress Notes (Signed)
 "  [name], D.O.  ABFM, ABOM Clinical Bariatric Medicine Physician  Office located at: [address] [place], [place]  [address]      FOR THE CHRONIC DISEASE OF OBESITY:  Chief complaint: Obesity Steven Gillespie is here to discuss his progress with his obesity treatment plan.   History of present illness / Interval history:  Steven Gillespie is here today for his first follow-up office visit since starting the program with us . Patient is off to a good start and has lost 2 lbs.    09/26/24 08:00 10/10/24 15:00   Body Fat % 34.6 % 34.4 %  Muscle Mass (lbs) 181.6 lbs 181.4 lbs  Fat Mass (lbs) 101.2 lbs 100 lbs  Total Body Water (lbs) 128.6 lbs 130.8 lbs  Visceral Fat Rating  20 20   Counseling done today with patient on how various foods will affect these numbers and how to maximize fat loss success.  Total lbs lost to date: - 2 lbs Total Fat Mass in lbs lost to date: + 2.8 lbs Total weight loss percentage to date: - 0.68 %    BMI 37.0-37.9, adult-Start BMI 37.47 Class 2 obesity with body mass index (BMI) of 37.0 to 37.9 in adult, Current BMI 37.22  Nutrition Therapy He is on the Category 3 Plan.    - Eating fresh, unprocessed foods? yes  - Eating lean proteins with each meal? yes  - Sleeping 7-9 Hours/ Night? yes  - Skipping Meals? yes  - States he is following his eating plan approximately 90 % of the time.  Cleaven is currently in the action stage of change. As such, his goal is to continue weight management plan.  He has agreed to:  Journal 300-400 Calories with 30+ g protein for Breakfast and follow Cat 3 MP   Physical Activity Viola is not exercising.   Rigel has been advised to work up to 300-450 minutes of moderate intensity aerobic activity a week and strengthening exercises 2-3 times per week for cardiovascular health, weight loss maintenance and preservation of muscle mass.  He has agreed to : Think about enjoyable ways to increase daily  physical activity and overcoming barriers to exercise and Increase physical activity in their day and reduce sedentary time (increase NEAT).   Behavioral Modifications Evidence-based interventions for health behavior change were utilized today including the discussion of  1) self monitoring techniques:    - Continue weighing lean proteins and measuring veggies  2) problem-solving barriers:    - Break proteins into smaller portions throughout the day  3) SMART goals for next OV:    - Meal prep on Sunday evenings  - Move more (increase NEAT)   Regarding patient's less desirable eating habits and patterns, we employed the technique of small changes.   We discussed the following today: increasing lean protein intake to established goals, work on meal planning and preparation, work on tracking and journaling calories using tracking application, reading food labels , keeping healthy foods at home, planning for success, focusing on food with a 10:1 ratio of calories: grams of protein, and discussed low calorie options for satisfying sweet tooth cravings;  ie fit crunch bars Additional resources provided today: Handout on the concepts of Adaptive Thermogenesis, Handout on Pre-Diabetes education , and Handout on insulin  resistance education   Medical Interventions/ Pharmacotherapy Previous Bariatric surgery: n/a Pharmacotherapy for weight loss: He is not currently taking medications  for medical weight loss.    We discussed various medication  options to help [name] with his weight loss efforts and we both agreed to : Continue with current nutritional and behavioral strategies   OBESITY RELATED CONDITIONS ADDRESSED TODAY: Extensive review of labs performed today that were drawn at the new patient office visit on 09/26/24.   Prediabetes Assessment & Plan Lab Results  Component Value Date   HGBA1C 6.3 08/29/2024   HGBA1C 6.0 01/30/2023   HGBA1C 5.9 (H) 10/15/2020   INSULIN  26.3 (H)  09/26/2024   INSULIN  22.6 10/15/2020   INSULIN  29.0 (H) 06/16/2020   Lab Results  Component Value Date   TSH 2.300 09/26/2024   T3TOTAL 117 06/16/2020   FREET4 1.09 09/26/2024      Component Value Date/Time   NA 140 08/29/2024 1538   NA 142 10/15/2020 0823   K 4.0 08/29/2024 1538   CL 104 08/29/2024 1538   CO2 28 08/29/2024 1538   GLUCOSE 89 08/29/2024 1538   BUN 21 08/29/2024 1538   BUN 22 10/15/2020 0823   CREATININE 1.23 08/29/2024 1538   CREATININE 1.26 04/03/2012 1017   CALCIUM  9.9 08/29/2024 1538   PROT 7.4 08/29/2024 1538   PROT 7.3 10/15/2020 0823   ALBUMIN 4.6 08/29/2024 1538   ALBUMIN 4.3 10/15/2020 0823   AST 25 08/29/2024 1538   ALT 31 08/29/2024 1538   ALKPHOS 51 08/29/2024 1538   BILITOT 0.4 08/29/2024 1538   BILITOT 0.3 10/15/2020 0823   GFR 66.54 08/29/2024 1538   GFRNONAA 67 10/15/2020 0823      Component Value Date/Time   WBC 6.4 08/29/2024 1538   RBC 4.81 08/29/2024 1538   HGB 14.5 08/29/2024 1538   HCT 42.5 08/29/2024 1538   PLT 231.0 08/29/2024 1538   MCV 88.5 08/29/2024 1538   MCH 29.7 04/03/2012 1017   MCHC 34.0 08/29/2024 1538   RDW 15.1 08/29/2024 1538   LYMPHSABS 3.1 08/29/2024 1538   MONOABS 0.6 08/29/2024 1538   EOSABS 0.0 08/29/2024 1538   BASOSABS 0.1 08/29/2024 1538     Diet and lifestyle controlled. States that the breakfast was hard due to the only thing he ate was eggs. Patient denies eating cheese or milk or cottage cheese. Cravings and hunger are well controlled. Patient states that the meal plan is a lot of food and he struggles to eat everything in the meal plan. Recommended that patient add some nuts, chia seeds, and fruits in order to get to goal calories while still getting goal protein in. Patients insulin  levels are above goal. Goal level is less than 10. Explained to patient the role of insulin  and how it is works to stimulate hunger and cravings signals when simple carbs are eaten. Patient A1C levels are close to the  diabetic range. Current levels are 6.3. As patient decreases adipose tissues and continue to follow prudent meal A1C will become in better control.   Thyroid  levels and CBC are at goal no acute concerns. CMP shows that creatinine levels are in the upper normal range. GFR levels are also in the upper normal range. Stressed to patient the importance of avoiding NSAIDs.Recommended that patient should increase water intake to help avoid chronic kidney disease.     Other hyperlipidemia Assessment & Plan Lab Results  Component Value Date   CHOL 144 08/29/2024   HDL 49.70 08/29/2024   LDLCALC 80 08/29/2024   LDLDIRECT 111.0 05/29/2020   TRIG 73.0 08/29/2024   CHOLHDL 3 08/29/2024    On Lipitor 40 mg daily with reported good compliance and tolerance.  Patient lipid panel is WNL. No acute concerns. Recommended that patient avoid saturated and trans fat. HDL levels will increase as patient increases exercise. Continue with medication.       Vitamin D  deficiency Assessment & Plan Lab Results  Component Value Date   VD25OH 35.5 09/26/2024   VD25OH 31.38 01/30/2023   VD25OH 32.9 10/15/2020   Lab Results  Component Value Date   VITAMINB12 394 09/26/2024   FOLATE 13.8 06/16/2020   Taking Vitamin D3 1000 units daily with reported good compliance and tolerance. Vit D levels are suboptimals. Reviewed with patient goal levels between 50 and 70. As patient looses adipose tissues more Vit D will be available for patient. Recommended that patient Increase supplementation to 2000 lU daily. Will recheck levels in 3-4 months. B12 levels are suboptimals. No acute concerns. As patient continues to eat healthy levels will continue to rise.      Medications Discontinued During This Encounter  Medication Reason   cholecalciferol  (VITAMIN D3) 25 MCG (1000 UNIT) tablet      Meds ordered this encounter  Medications   cholecalciferol  (VITAMIN D3) 25 MCG (1000 UNIT) tablet    Sig: Take 2 tablets (2,000  Units total) by mouth daily.     FOLLOW UP:   Return 10/24/2024 at 2:00 PM  He was informed of the importance of frequent follow up visits to maximize his success with intensive lifestyle modifications for his multiple health conditions.   Weight Summary and Biometrics   Weight Lost Since Last Visit: 2lb  Weight Gained Since Last Visit: 0lb   No data recorded Anthropometric Measurements Height: 6' 2 (1.88 m) Weight at Last Visit: 292lb Starting Weight: 292lb Peak Weight: 292lb   Body Composition  Body Fat %: 34.4 % Fat Mass (lbs): 100 lbs Muscle Mass (lbs): 181.4 lbs Total Body Water (lbs): 130.8 lbs Visceral Fat Rating : 20   Other Clinical Data Fasting: no Labs: no Today's Visit #: 2 Starting Date: 09/26/24 Comments: NPFU     Objective:  Review of Systems:  Pertinent positives were addressed with patient today.   Reviewed by clinician on day of visit: allergies, medications, problem list, medical history, surgical history, family history, social history, and previous encounter notes.  PHYSICAL EXAM: Height 6' 2 (1.88 m). Body mass index is 37.49 kg/m. General: he is overweight, cooperative and in no acute distress.   HEENT: EOMI, sclerae are anicteric. Lungs: Normal breathing effort, no conversational dyspnea. M-Sk:  Normal gross ROM * 4 extremities  PSYCH: Has normal mood, affect and thought process. Neurologic: No gross sensory or motor deficits. Well developed, A and O * 3  DIAGNOSTIC DATA REVIEWED:  BMET    Component Value Date/Time   NA 140 08/29/2024 1538   NA 142 10/15/2020 0823   K 4.0 08/29/2024 1538   CL 104 08/29/2024 1538   CO2 28 08/29/2024 1538   GLUCOSE 89 08/29/2024 1538   BUN 21 08/29/2024 1538   BUN 22 10/15/2020 0823   CREATININE 1.23 08/29/2024 1538   CREATININE 1.26 04/03/2012 1017   CALCIUM  9.9 08/29/2024 1538   GFRNONAA 67 10/15/2020 0823   GFRAA 77 10/15/2020 0823   Lab Results  Component Value Date   HGBA1C 6.3  08/29/2024   HGBA1C 5.8 (H) 06/16/2020   Lab Results  Component Value Date   INSULIN  26.3 (H) 09/26/2024   INSULIN  29.0 (H) 06/16/2020   Lab Results  Component Value Date   TSH 2.300 09/26/2024   CBC  Component Value Date/Time   WBC 6.4 08/29/2024 1538   RBC 4.81 08/29/2024 1538   HGB 14.5 08/29/2024 1538   HCT 42.5 08/29/2024 1538   PLT 231.0 08/29/2024 1538   MCV 88.5 08/29/2024 1538   MCH 29.7 04/03/2012 1017   MCHC 34.0 08/29/2024 1538   RDW 15.1 08/29/2024 1538   Iron Studies No results found for: IRON, TIBC, FERRITIN, IRONPCTSAT Lipid Panel     Component Value Date/Time   CHOL 144 08/29/2024 1538   CHOL 173 10/15/2020 0823   TRIG 73.0 08/29/2024 1538   HDL 49.70 08/29/2024 1538   HDL 46 10/15/2020 0823   CHOLHDL 3 08/29/2024 1538   VLDL 14.6 08/29/2024 1538   LDLCALC 80 08/29/2024 1538   LDLCALC 114 (H) 10/15/2020 0823   LDLDIRECT 111.0 05/29/2020 1127   Hepatic Function Panel     Component Value Date/Time   PROT 7.4 08/29/2024 1538   PROT 7.3 10/15/2020 0823   ALBUMIN 4.6 08/29/2024 1538   ALBUMIN 4.3 10/15/2020 0823   AST 25 08/29/2024 1538   ALT 31 08/29/2024 1538   ALKPHOS 51 08/29/2024 1538   BILITOT 0.4 08/29/2024 1538   BILITOT 0.3 10/15/2020 0823      Component Value Date/Time   TSH 2.300 09/26/2024 0958   Nutritional Lab Results  Component Value Date   VD25OH 35.5 09/26/2024   VD25OH 31.38 01/30/2023   VD25OH 32.9 10/15/2020     Attestations:   I, Sonny Laroche, acting as a stage manager for Barnie Jenkins, DO., have compiled all relevant documentation for today's office visit on behalf of Barnie Jenkins, DO, while in the presence of [employer], DO.  I have spent 70 minutes in the care of the patient today.  60 minutes was spent on face-to-face counseling and reviewing listed medical problems above as outlined in office visit note, providing nutritional and behavioral counseling as outlined in obesity care plan,  independently interpreting results and goals of care, see listed medical problems, and discussing biometric information and progress. We reviewed his meal plan and discussed how the foods he's eating is affecting each one of his labs. Pt educated on why we want him to eat various foods in various amounts and has a better understanding of the nutritional plan because of this. All questions were answered today. 10 minutes was spent on pre-chart review and additional documentation after the visit.   I have reviewed the above documentation for accuracy and completeness, and I agree with the above. Barnie JINNY Jenkins, D.O.  The 21st Century Cures Act was signed into law in 2016 which includes the topic of electronic health records.  This provides immediate access to information in MyChart.  This includes consultation notes, operative notes, office notes, lab results and pathology reports.  If you have any questions about what you read please let us  know at your next visit so we can discuss your concerns and take corrective action if need be.  We are right here with you. "

## 2024-10-17 ENCOUNTER — Other Ambulatory Visit: Payer: Self-pay

## 2024-10-17 ENCOUNTER — Other Ambulatory Visit (HOSPITAL_COMMUNITY): Payer: Self-pay

## 2024-10-17 ENCOUNTER — Other Ambulatory Visit: Payer: Self-pay | Admitting: Family Medicine

## 2024-10-17 DIAGNOSIS — M25561 Pain in right knee: Secondary | ICD-10-CM

## 2024-10-17 DIAGNOSIS — E7800 Pure hypercholesterolemia, unspecified: Secondary | ICD-10-CM

## 2024-10-17 MED ORDER — MELOXICAM 15 MG PO TABS
15.0000 mg | ORAL_TABLET | Freq: Every day | ORAL | 0 refills | Status: AC
  Filled 2024-10-17: qty 30, 30d supply, fill #0

## 2024-10-17 MED ORDER — ATORVASTATIN CALCIUM 40 MG PO TABS
40.0000 mg | ORAL_TABLET | Freq: Every day | ORAL | 3 refills | Status: AC
  Filled 2024-10-17: qty 90, fill #0

## 2024-10-24 ENCOUNTER — Ambulatory Visit (INDEPENDENT_AMBULATORY_CARE_PROVIDER_SITE_OTHER): Admitting: Family Medicine

## 2024-11-07 ENCOUNTER — Ambulatory Visit (INDEPENDENT_AMBULATORY_CARE_PROVIDER_SITE_OTHER): Admitting: Family Medicine

## 2025-09-02 ENCOUNTER — Encounter: Admitting: Family Medicine
